# Patient Record
Sex: Female | Born: 1965
Health system: Southern US, Community
[De-identification: ages and names within clinical notes are randomized; demographics above are authoritative.]

## PROBLEM LIST (undated history)

## (undated) ENCOUNTER — Emergency Department (HOSPITAL_COMMUNITY)

## (undated) DIAGNOSIS — E785 Hyperlipidemia, unspecified: Secondary | ICD-10-CM

## (undated) DIAGNOSIS — I10 Essential (primary) hypertension: Secondary | ICD-10-CM

## (undated) DIAGNOSIS — F4310 Post-traumatic stress disorder, unspecified: Secondary | ICD-10-CM

## (undated) DIAGNOSIS — Z6841 Body Mass Index (BMI) 40.0 and over, adult: Secondary | ICD-10-CM

## (undated) DIAGNOSIS — I4891 Unspecified atrial fibrillation: Secondary | ICD-10-CM

## (undated) DIAGNOSIS — M1990 Unspecified osteoarthritis, unspecified site: Secondary | ICD-10-CM

## (undated) DIAGNOSIS — I517 Cardiomegaly: Secondary | ICD-10-CM

## (undated) HISTORY — DX: Hyperlipidemia, unspecified: E78.5

## (undated) HISTORY — DX: Body Mass Index (BMI) 40.0 and over, adult: Z684

## (undated) HISTORY — DX: Post-traumatic stress disorder, unspecified: F43.10

## (undated) HISTORY — DX: Unspecified osteoarthritis, unspecified site: M19.90

## (undated) HISTORY — DX: Essential (primary) hypertension: I10

## (undated) HISTORY — DX: Cardiomegaly: I51.7

## (undated) HISTORY — DX: Morbid (severe) obesity due to excess calories: E66.01

---

## 2000-08-11 ENCOUNTER — Emergency Department (HOSPITAL_COMMUNITY): Admission: EM | Admit: 2000-08-11 | Discharge: 2000-08-11 | Payer: Self-pay

## 2003-08-27 ENCOUNTER — Ambulatory Visit (HOSPITAL_COMMUNITY): Admission: RE | Admit: 2003-08-27 | Discharge: 2003-08-27 | Payer: Self-pay | Admitting: Family Medicine

## 2003-08-27 ENCOUNTER — Encounter: Payer: Self-pay | Admitting: Occupational Therapy

## 2007-03-01 ENCOUNTER — Emergency Department (HOSPITAL_COMMUNITY): Admission: EM | Admit: 2007-03-01 | Discharge: 2007-03-01 | Payer: Self-pay | Admitting: Emergency Medicine

## 2014-12-10 ENCOUNTER — Other Ambulatory Visit (HOSPITAL_COMMUNITY): Payer: Self-pay | Admitting: *Deleted

## 2014-12-10 DIAGNOSIS — N6310 Unspecified lump in the right breast, unspecified quadrant: Secondary | ICD-10-CM

## 2014-12-10 DIAGNOSIS — N644 Mastodynia: Secondary | ICD-10-CM

## 2014-12-20 ENCOUNTER — Ambulatory Visit (HOSPITAL_COMMUNITY)
Admission: RE | Admit: 2014-12-20 | Discharge: 2014-12-20 | Disposition: A | Discharge status: Home / Self Care | Payer: Self-pay | Source: Ambulatory Visit | Attending: Obstetrics and Gynecology | Admitting: Obstetrics and Gynecology

## 2014-12-20 ENCOUNTER — Encounter (HOSPITAL_COMMUNITY): Payer: Self-pay

## 2014-12-20 VITALS — BP 136/94 | Temp 98.1°F | Ht 65.0 in | Wt 215.4 lb

## 2014-12-20 DIAGNOSIS — Z01419 Encounter for gynecological examination (general) (routine) without abnormal findings: Secondary | ICD-10-CM

## 2014-12-20 DIAGNOSIS — N644 Mastodynia: Secondary | ICD-10-CM

## 2014-12-20 NOTE — Progress Notes (Signed)
Complaints of bilateral breast pain x 3 weeks that comes and goes. Patient rates pain at a 3 out of 10. Patient complained of a right breast lump x 2 weeks.  Pap Smear:  Pap smear completed today. Patients last Pap smear was 5 years ago and normal per patient. Per patient has no history of an abnormal Pap smear. No Pap smear results in EPIC.  Physical exam: Breasts Breasts symmetrical. No skin abnormalities bilateral breasts. No nipple retraction bilateral breasts. No nipple discharge bilateral breasts. No lymphadenopathy. No lumps palpated bilateral breasts. No lumps palpated in patients area of concern. Complaints of left inner and right breast tenderness on exam. [facility] to [facility] for diagnostic mamm[facility]. Appointment scheduled for Friday, December 21, 2014 at 1515.           Pelvic/Bimanual   Ext Genitalia No lesions, no swelling and no discharge observed on external genitalia.         Vagina Vagina pink and normal texture. No lesions or discharge observed in vagina.          Cervix Cervix is present. Cervix pink and of normal texture. No discharge observed.     Uterus Uterus is present and palpable. Uterus in retroverted and normal size.        Adnexae Bilateral ovaries present and unable to palpated. No tenderness on palpation.          Rectovaginal No rectal exam completed today since patient had no rectal complaints. No skin abnormalities observed on exam.

## 2014-12-20 NOTE — Patient Instructions (Signed)
Explained to Jaci CarrelSharon Aldava that [facility] will cover Pap smears and co-testing every 5 years unless has a history of abnormal Pap smears. [facility]. Appointment scheduled for Friday, December 21, 2014 at 1515. Patient aware of appointment and will be there. Let patient know will follow up with her within the next couple weeks with results of Pap smear by phone. Jaci CarrelSharon Farias verbalized understanding.

## 2014-12-21 ENCOUNTER — Other Ambulatory Visit: Payer: Self-pay

## 2014-12-24 ENCOUNTER — Other Ambulatory Visit (HOSPITAL_COMMUNITY): Payer: Self-pay | Admitting: *Deleted

## 2014-12-24 ENCOUNTER — Telehealth (HOSPITAL_COMMUNITY): Payer: Self-pay | Admitting: *Deleted

## 2014-12-24 DIAGNOSIS — A599 Trichomoniasis, unspecified: Secondary | ICD-10-CM

## 2014-12-24 LAB — CYTOLOGY - PAP

## 2014-12-24 MED ORDER — METRONIDAZOLE 500 MG PO TABS: 500.0000 mg | ORAL_TABLET | Freq: Two times a day (BID) | ORAL | Status: DC

## 2014-12-24 NOTE — Telephone Encounter (Signed)
Telephoned patient at home # and discussed negative pap smear results. Also negative HPV results. Next pap smear due in 5 years. Advised patient that pap smear did show trichomoniasis and that med was called to pharmacy. Patient will need to finish all medication and that partner will need to also be treated. Patient voiced understanding.

## 2014-12-31 ENCOUNTER — Ambulatory Visit
Admission: RE | Admit: 2014-12-31 | Discharge: 2014-12-31 | Disposition: A | Discharge status: Home / Self Care | Payer: No Typology Code available for payment source | Source: Ambulatory Visit | Attending: Obstetrics and Gynecology | Admitting: Obstetrics and Gynecology

## 2014-12-31 DIAGNOSIS — N644 Mastodynia: Secondary | ICD-10-CM

## 2014-12-31 DIAGNOSIS — N6310 Unspecified lump in the right breast, unspecified quadrant: Secondary | ICD-10-CM

## 2015-01-03 ENCOUNTER — Ambulatory Visit: Payer: Self-pay

## 2015-01-03 ENCOUNTER — Other Ambulatory Visit: Payer: Self-pay

## 2016-04-11 ENCOUNTER — Emergency Department (HOSPITAL_COMMUNITY)
Admission: EM | Admit: 2016-04-11 | Discharge: 2016-04-11 | Disposition: A | Discharge status: Home / Self Care | Payer: BLUE CROSS/BLUE SHIELD | Attending: Emergency Medicine | Admitting: Emergency Medicine

## 2016-04-11 ENCOUNTER — Encounter (HOSPITAL_COMMUNITY): Payer: Self-pay | Admitting: Emergency Medicine

## 2016-04-11 DIAGNOSIS — Z79899 Other long term (current) drug therapy: Secondary | ICD-10-CM | POA: Diagnosis not present

## 2016-04-11 DIAGNOSIS — K047 Periapical abscess without sinus: Secondary | ICD-10-CM

## 2016-04-11 DIAGNOSIS — K1379 Other lesions of oral mucosa: Secondary | ICD-10-CM | POA: Diagnosis not present

## 2016-04-11 DIAGNOSIS — Z87891 Personal history of nicotine dependence: Secondary | ICD-10-CM | POA: Insufficient documentation

## 2016-04-11 MED ORDER — HYDROCODONE-ACETAMINOPHEN 5-325 MG PO TABS: ORAL_TABLET | ORAL | Status: DC

## 2016-04-11 MED ORDER — HYDROCODONE-ACETAMINOPHEN 5-325 MG PO TABS
1.0000 | ORAL_TABLET | Freq: Once | ORAL | Status: AC
  Administered 2016-04-11: 1 via ORAL
  Filled 2016-04-11: qty 1

## 2016-04-11 MED ORDER — AMOXICILLIN 500 MG PO CAPS
1000.0000 mg | ORAL_CAPSULE | Freq: Once | ORAL | Status: AC
  Administered 2016-04-11: 1000 mg via ORAL
  Filled 2016-04-11: qty 2

## 2016-04-11 MED ORDER — AMOXICILLIN 500 MG PO CAPS: 1000.0000 mg | ORAL_CAPSULE | Freq: Two times a day (BID) | ORAL | Status: DC

## 2016-04-11 NOTE — ED Provider Notes (Signed)
CSN: [id number]     Arrival date & time 04/11/16  1616 History  By signing my name below, I, Audrey DarnerRussell Guerra, attest that this documentation has been prepared under the direction and in the presence of non-physician practitioner, Audrey EmeryNicole Grover Woodfield, PA-C. Electronically Signed: Linna Darnerussell Guerra, Scribe. 04/11/2016. 5:42 PM.   Chief Complaint  Patient presents with  . Mouth Lesions    The history is provided by the patient. No language interpreter was used.     HPI Comments: Audrey Guerra is a 50 y.o. female with no significant PMHx who presents to the Emergency Department complaining of sudden onset oral lesions beginning yesterday. Pt states that she has severely painful sores on the roof of her mouth; she states that the pain radiates into her nose, bilateral eyes, and bilateral ears. She endorses significant pain exacerbation with palpation to the roof of her mouth. Pt has no known allergies to medication. She has taken Tylenol #3 500 mg with mild relief.  Pt additionally here with concern for STD. She endorses pain with sexual intercourse and right hip pain She denies abnormal vaginal discharge.   History reviewed. No pertinent past medical history. Past Surgical History  Procedure Laterality Date  . Cesarean section      2 previous c-sections   Family History  Problem Relation Age of Onset  . Diabetes Mother   . Diabetes Father    Social History  Substance Use Topics  . Smoking status: Former Games developermoker  . Smokeless tobacco: Never Used  . Alcohol Use: Yes     Comment: socially   OB History    Gravida Para Term Preterm AB TAB SAB Ectopic Multiple Living   3 2 2  1 1    2      Review of Systems  A complete 10 system review of systems was obtained and all systems are negative except as noted in the HPI and PMH.   Allergies  Review of patient's allergies indicates no known allergies.  Home Medications   Prior to Admission medications   Medication Sig Start Date End Date  Taking? Authorizing Provider  metroNIDAZOLE (FLAGYL) 500 MG tablet Take 1 tablet (500 mg total) by mouth 2 (two) times daily. 12/24/14   Peggy Constant, MD   BP 171/101 mmHg  Pulse 79  Temp(Src) 98.4 F (36.9 C) (Oral)  Resp 18  SpO2 98% Physical Exam  Constitutional: She is oriented to person, place, and time. She appears well-developed and well-nourished. No distress.  HENT:  Head: Normocephalic and atraumatic.  Mouth/Throat: Uvula is midline and oropharynx is clear and moist. No trismus in the jaw. No uvula swelling. No oropharyngeal exudate, posterior oropharyngeal edema, posterior oropharyngeal erythema or tonsillar abscesses.    Generally poor dentition, no gingival swelling, erythema or tenderness to palpation. Patient is handling their secretions. There is no tenderness to palpation or firmness underneath tongue bilaterally. No trismus.    Eyes: Conjunctivae and EOM are normal. Pupils are equal, round, and reactive to light.  Neck: Neck supple. No tracheal deviation present.  Cardiovascular: Normal rate, regular rhythm and intact distal pulses.   Pulmonary/Chest: Effort normal and breath sounds normal. No stridor. No respiratory distress. She has no wheezes. She has no rales. She exhibits no tenderness.  Abdominal: Soft. She exhibits no distension and no mass. There is no tenderness. There is no rebound and no guarding.  Musculoskeletal: Normal range of motion.  Lymphadenopathy:    She has no cervical adenopathy.  Neurological: She is alert and  oriented to person, place, and time.  Skin: Skin is warm and dry.  Psychiatric: She has a normal mood and affect. Her behavior is normal.  Nursing note and vitals reviewed.   ED Course  Procedures (including critical care time)  DIAGNOSTIC STUDIES: Oxygen Saturation is 98% on RA, normal by my interpretation.    COORDINATION OF CARE: 5:42 PM Discussed treatment plan with pt at bedside and pt agreed to plan.  Labs Review Labs  Reviewed - No data to display  Imaging Review No results found. I have personally reviewed and evaluated these images and lab results as part of my medical decision-making.   EKG Interpretation None      MDM   Final diagnoses:  Dentoalveolar abscess    Filed Vitals:   04/11/16 1621 04/11/16 1839  BP: 171/101   Pulse: 79 78  Temp: 98.4 F (36.9 C)   TempSrc: Oral   Resp: 18 18  SpO2: 98%     Medications  amoxicillin (AMOXIL) capsule 1,000 mg (1,000 mg Oral Given 04/11/16 1839)  HYDROcodone-acetaminophen (NORCO/VICODIN) 5-325 MG per tablet 1 tablet (1 tablet Oral Given 04/11/16 1839)    Audrey Guerra is 50 y.o. female presenting with painful lesion to mouth, she is also requesting exam for STDs, she has dyspareunia with no discharge. She was initially sent back to the waiting room so she could be evaluated a higher level of care we could accomplish a pelvic exam and further evaluate for STDs, patient changed her mind and states she just wants to be treated for her mouth lesions and states that she can follow with her primary care for further eval for evaluation. Patient has assured me that she really does not want the STD screen tonight, I told her we will be happy to accommodate her when a room becomes available and she states that she would like to go. Physical exam is consistent with a dental abscess, she has multiple dental caries and some swelling on the hard palate. Patient will be started on amoxicillin and Vicodin, encouraged her to follow closely with her dentist. Patient verbalizes understanding.  Evaluation does not show pathology that would require ongoing emergent intervention or inpatient treatment. Pt is hemodynamically stable and mentating appropriately. Discussed findings and plan with patient/guardian, who agrees with care plan. All questions answered. Return precautions discussed and outpatient follow up given.   Discharge Medication List as of 04/11/2016  6:33  PM    START taking these medications   Details  amoxicillin (AMOXIL) 500 MG capsule Take 2 capsules (1,000 mg total) by mouth 2 (two) times daily., Starting 04/11/2016, Until Discontinued, Print    HYDROcodone-acetaminophen (NORCO/VICODIN) 5-325 MG tablet Take 1-2 tablets by mouth every 6 hours as needed for pain and/or cough., Print           Audrey Emery, PA-C 04/11/16 2000  Azalia Bilis, MD 04/12/16 [phone]

## 2016-04-11 NOTE — Discharge Instructions (Signed)
Take vicodin for breakthrough pain, do not drink alcohol, drive, care for children or do other critical tasks while taking vicodin.  Return to the emergency room for fever, change in vision, redness to the face that rapidly spreads towards the eye, nausea or vomiting, difficulty swallowing or shortness of breath.   Apply warm compresses to jaw throughout the day.   Take your antibiotics as directed and to the end of the course.   Followup with a dentist is very important for ongoing evaluation and management of recurrent dental pain. Return to emergency department for emergent changing or worsening symptoms."  Dental Abscess A dental abscess is a collection of pus in or around a tooth. CAUSES This condition is caused by a bacterial infection around the root of the tooth that involves the inner part of the tooth (pulp). It may result from:  Severe tooth decay.  Trauma to the tooth that allows bacteria to enter into the pulp, such as a broken or chipped tooth.  Severe gum disease around a tooth. SYMPTOMS Symptoms of this condition include:  Severe pain in and around the infected tooth.  Swelling and redness around the infected tooth, in the mouth, or in the face.  Tenderness.  Pus drainage.  Bad breath.  Bitter taste in the mouth.  Difficulty swallowing.  Difficulty opening the mouth.  Nausea.  Vomiting.  Chills.  Swollen neck glands.  Fever. DIAGNOSIS This condition is diagnosed with examination of the infected tooth. During the exam, your dentist may tap on the infected tooth. Your dentist will also ask about your medical and dental history and may order X-rays. TREATMENT This condition is treated by eliminating the infection. This may be done with:  Antibiotic medicine.  A root canal. This may be performed to save the tooth.  Pulling (extracting) the tooth. This may also involve draining the abscess. This is done if the tooth cannot be saved. HOME CARE  INSTRUCTIONS  Take medicines only as directed by your dentist.  If you were prescribed antibiotic medicine, finish all of it even if you start to feel better.  Rinse your mouth (gargle) often with salt water to relieve pain or swelling.  Do not drive or operate heavy machinery while taking pain medicine.  Do not apply heat to the outside of your mouth.  Keep all follow-up visits as directed by your dentist. This is important. SEEK MEDICAL CARE IF:  Your pain is worse and is not helped by medicine. SEEK IMMEDIATE MEDICAL CARE IF:  You have a fever or chills.  Your symptoms suddenly get worse.  You have a very bad headache.  You have problems breathing or swallowing.  You have trouble opening your mouth.  You have swelling in your neck or around your eye.   This information is not intended to replace advice given to you by your health care provider. Make sure you discuss any questions you have with your health care provider.   Document Released: 11/30/2005 Document Revised: 04/16/2015 Document Reviewed: 11/27/2014 Elsevier Interactive Patient Education 2016 [employer].  Low-cost dental clinic: [facility]  at [phone] Quin may also call [phone]  Dental Assistance If the dentist on-call cannot see you, please use the resources below:   Patients with Medicaid: [facility] [phone] [address], [phone] [address], [phone]  If unable to pay, or uninsured, contact [facility] [phone]) or [facility] Department [phone] in [place], [phone] in [place]) to become  qualified for the adult dental clinic  Other Low-Cost Community Dental Services: [facility]- [address], [place], [place], [address]    [phone], Ext. [phone]    2nd and 4th Thursday of the month at 6:30am    10 clients each day by appointment, can sometimes see walk-in     patients if someone does not show for an appointment [facility]- [address], [place], [place], [address]    [phone] [facility]- [address], [place], [place], [address]    [phone]  [facility]- [phone] [facility]- [phone] [facility]- [phone]

## 2016-04-11 NOTE — ED Notes (Signed)
Pt states she has had sores on the top of her mouth that pt noticed yesterday. Pt is worried she has an STD. Pt hs three red open spots on the roof of her mouth. Pt is very anxious about this at time of assessment because she states "she recently lost a friend to an unknown illness and she doesn't want the same thing to happen".

## 2016-04-11 NOTE — ED Notes (Signed)
Pt restated that she only wants to be seen for her mouth sores, not an STD test

## 2016-04-30 DIAGNOSIS — R109 Unspecified abdominal pain: Secondary | ICD-10-CM | POA: Diagnosis not present

## 2016-04-30 DIAGNOSIS — N390 Urinary tract infection, site not specified: Secondary | ICD-10-CM | POA: Diagnosis not present

## 2016-04-30 DIAGNOSIS — Z6838 Body mass index (BMI) 38.0-38.9, adult: Secondary | ICD-10-CM | POA: Diagnosis not present

## 2016-04-30 DIAGNOSIS — Z01419 Encounter for gynecological examination (general) (routine) without abnormal findings: Secondary | ICD-10-CM | POA: Diagnosis not present

## 2016-04-30 DIAGNOSIS — Z0142 Encounter for cervical smear to confirm findings of recent normal smear following initial abnormal smear: Secondary | ICD-10-CM | POA: Diagnosis not present

## 2016-04-30 DIAGNOSIS — Z113 Encounter for screening for infections with a predominantly sexual mode of transmission: Secondary | ICD-10-CM | POA: Diagnosis not present

## 2016-04-30 DIAGNOSIS — N760 Acute vaginitis: Secondary | ICD-10-CM | POA: Diagnosis not present

## 2016-05-14 DIAGNOSIS — Z6838 Body mass index (BMI) 38.0-38.9, adult: Secondary | ICD-10-CM | POA: Diagnosis not present

## 2016-05-14 DIAGNOSIS — M549 Dorsalgia, unspecified: Secondary | ICD-10-CM | POA: Diagnosis not present

## 2016-05-14 DIAGNOSIS — N390 Urinary tract infection, site not specified: Secondary | ICD-10-CM | POA: Diagnosis not present

## 2016-06-01 DIAGNOSIS — D125 Benign neoplasm of sigmoid colon: Secondary | ICD-10-CM | POA: Diagnosis not present

## 2016-06-01 DIAGNOSIS — Z8371 Family history of colonic polyps: Secondary | ICD-10-CM | POA: Diagnosis not present

## 2016-06-01 DIAGNOSIS — K5730 Diverticulosis of large intestine without perforation or abscess without bleeding: Secondary | ICD-10-CM | POA: Diagnosis not present

## 2016-06-01 DIAGNOSIS — Z1211 Encounter for screening for malignant neoplasm of colon: Secondary | ICD-10-CM | POA: Diagnosis not present

## 2016-06-02 DIAGNOSIS — Z0000 Encounter for general adult medical examination without abnormal findings: Secondary | ICD-10-CM | POA: Diagnosis not present

## 2016-06-02 DIAGNOSIS — Z13220 Encounter for screening for lipoid disorders: Secondary | ICD-10-CM | POA: Diagnosis not present

## 2016-06-04 DIAGNOSIS — Z23 Encounter for immunization: Secondary | ICD-10-CM | POA: Diagnosis not present

## 2016-06-04 DIAGNOSIS — Z0000 Encounter for general adult medical examination without abnormal findings: Secondary | ICD-10-CM | POA: Diagnosis not present

## 2017-02-14 DIAGNOSIS — M549 Dorsalgia, unspecified: Secondary | ICD-10-CM | POA: Diagnosis not present

## 2017-02-14 DIAGNOSIS — R030 Elevated blood-pressure reading, without diagnosis of hypertension: Secondary | ICD-10-CM | POA: Diagnosis not present

## 2017-02-14 DIAGNOSIS — K047 Periapical abscess without sinus: Secondary | ICD-10-CM | POA: Diagnosis not present

## 2017-02-14 DIAGNOSIS — K029 Dental caries, unspecified: Secondary | ICD-10-CM | POA: Diagnosis not present

## 2017-06-21 ENCOUNTER — Emergency Department (HOSPITAL_COMMUNITY): Payer: BLUE CROSS/BLUE SHIELD

## 2017-06-21 ENCOUNTER — Encounter (HOSPITAL_COMMUNITY): Payer: Self-pay

## 2017-06-21 ENCOUNTER — Emergency Department (HOSPITAL_COMMUNITY)
Admission: EM | Admit: 2017-06-21 | Discharge: 2017-06-21 | Disposition: A | Discharge status: Home / Self Care | Payer: BLUE CROSS/BLUE SHIELD | Attending: Emergency Medicine | Admitting: Emergency Medicine

## 2017-06-21 DIAGNOSIS — Z87891 Personal history of nicotine dependence: Secondary | ICD-10-CM | POA: Diagnosis not present

## 2017-06-21 DIAGNOSIS — M25561 Pain in right knee: Secondary | ICD-10-CM | POA: Diagnosis not present

## 2017-06-21 DIAGNOSIS — Y999 Unspecified external cause status: Secondary | ICD-10-CM | POA: Diagnosis not present

## 2017-06-21 DIAGNOSIS — Y929 Unspecified place or not applicable: Secondary | ICD-10-CM | POA: Insufficient documentation

## 2017-06-21 DIAGNOSIS — Z041 Encounter for examination and observation following transport accident: Secondary | ICD-10-CM | POA: Diagnosis not present

## 2017-06-21 DIAGNOSIS — S39012A Strain of muscle, fascia and tendon of lower back, initial encounter: Secondary | ICD-10-CM | POA: Diagnosis not present

## 2017-06-21 DIAGNOSIS — Y939 Activity, unspecified: Secondary | ICD-10-CM | POA: Insufficient documentation

## 2017-06-21 DIAGNOSIS — S8001XA Contusion of right knee, initial encounter: Secondary | ICD-10-CM

## 2017-06-21 DIAGNOSIS — S8991XA Unspecified injury of right lower leg, initial encounter: Secondary | ICD-10-CM | POA: Diagnosis not present

## 2017-06-21 MED ORDER — TRAMADOL HCL 50 MG PO TABS: 50.0000 mg | ORAL_TABLET | Freq: Four times a day (QID) | ORAL | 0 refills | Status: DC | PRN

## 2017-06-21 MED ORDER — METHOCARBAMOL 500 MG PO TABS
500.0000 mg | ORAL_TABLET | Freq: Two times a day (BID) | ORAL | 0 refills | Status: DC
Start: 2017-06-21 — End: 2018-09-13

## 2017-06-21 MED ORDER — NAPROXEN 500 MG PO TABS: 500.0000 mg | ORAL_TABLET | Freq: Two times a day (BID) | ORAL | 0 refills | Status: DC
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
## 2017-06-21 NOTE — Discharge Instructions (Signed)
Ice, elevate your knee. Naprosyn for pain as prescribed. Tramadol for severe pain. Robaxin for spasms. Follow up with family doctor if not improving in 3-5 days as needed. Return if worsening symptoms, numbness, weakness, any new concerning symptom.

## 2017-06-29 DIAGNOSIS — M545 Low back pain: Secondary | ICD-10-CM | POA: Diagnosis not present

## 2017-06-29 DIAGNOSIS — M79604 Pain in right leg: Secondary | ICD-10-CM | POA: Diagnosis not present

## 2017-06-29 DIAGNOSIS — M79621 Pain in right upper arm: Secondary | ICD-10-CM | POA: Diagnosis not present

## 2017-06-30 ENCOUNTER — Ambulatory Visit
Admission: RE | Admit: 2017-06-30 | Discharge: 2017-06-30 | Disposition: A | Discharge status: Home / Self Care | Payer: BLUE CROSS/BLUE SHIELD | Source: Ambulatory Visit | Attending: Family Medicine | Admitting: Family Medicine

## 2017-06-30 ENCOUNTER — Other Ambulatory Visit: Payer: Self-pay | Admitting: Family Medicine

## 2017-06-30 DIAGNOSIS — M25531 Pain in right wrist: Secondary | ICD-10-CM | POA: Diagnosis not present

## 2017-06-30 DIAGNOSIS — R52 Pain, unspecified: Secondary | ICD-10-CM

## 2017-06-30 DIAGNOSIS — S6991XA Unspecified injury of right wrist, hand and finger(s), initial encounter: Secondary | ICD-10-CM | POA: Diagnosis not present

## 2017-06-30 DIAGNOSIS — M19011 Primary osteoarthritis, right shoulder: Secondary | ICD-10-CM | POA: Diagnosis not present

## 2017-07-13 DIAGNOSIS — R262 Difficulty in walking, not elsewhere classified: Secondary | ICD-10-CM | POA: Diagnosis not present

## 2017-07-13 DIAGNOSIS — M25561 Pain in right knee: Secondary | ICD-10-CM | POA: Diagnosis not present

## 2017-07-13 DIAGNOSIS — M25551 Pain in right hip: Secondary | ICD-10-CM | POA: Diagnosis not present

## 2017-07-13 DIAGNOSIS — M6281 Muscle weakness (generalized): Secondary | ICD-10-CM | POA: Diagnosis not present

## 2017-07-22 DIAGNOSIS — R262 Difficulty in walking, not elsewhere classified: Secondary | ICD-10-CM | POA: Diagnosis not present

## 2017-07-22 DIAGNOSIS — M25551 Pain in right hip: Secondary | ICD-10-CM | POA: Diagnosis not present

## 2017-07-22 DIAGNOSIS — M545 Low back pain: Secondary | ICD-10-CM | POA: Diagnosis not present

## 2017-07-22 DIAGNOSIS — M25561 Pain in right knee: Secondary | ICD-10-CM | POA: Diagnosis not present

## 2017-07-27 DIAGNOSIS — M25561 Pain in right knee: Secondary | ICD-10-CM | POA: Diagnosis not present

## 2017-07-27 DIAGNOSIS — M79621 Pain in right upper arm: Secondary | ICD-10-CM | POA: Diagnosis not present

## 2017-07-27 DIAGNOSIS — M25551 Pain in right hip: Secondary | ICD-10-CM | POA: Diagnosis not present

## 2017-07-27 DIAGNOSIS — M25511 Pain in right shoulder: Secondary | ICD-10-CM | POA: Diagnosis not present

## 2017-07-27 DIAGNOSIS — R739 Hyperglycemia, unspecified: Secondary | ICD-10-CM | POA: Diagnosis not present

## 2017-07-27 DIAGNOSIS — M79604 Pain in right leg: Secondary | ICD-10-CM | POA: Diagnosis not present

## 2017-07-27 DIAGNOSIS — R262 Difficulty in walking, not elsewhere classified: Secondary | ICD-10-CM | POA: Diagnosis not present

## 2017-07-27 DIAGNOSIS — Z6838 Body mass index (BMI) 38.0-38.9, adult: Secondary | ICD-10-CM | POA: Diagnosis not present

## 2017-07-29 DIAGNOSIS — R262 Difficulty in walking, not elsewhere classified: Secondary | ICD-10-CM | POA: Diagnosis not present

## 2017-07-29 DIAGNOSIS — M25551 Pain in right hip: Secondary | ICD-10-CM | POA: Diagnosis not present

## 2017-07-29 DIAGNOSIS — M25561 Pain in right knee: Secondary | ICD-10-CM | POA: Diagnosis not present

## 2017-07-29 DIAGNOSIS — M545 Low back pain: Secondary | ICD-10-CM | POA: Diagnosis not present

## 2017-09-07 DIAGNOSIS — Z6839 Body mass index (BMI) 39.0-39.9, adult: Secondary | ICD-10-CM | POA: Diagnosis not present

## 2017-09-07 DIAGNOSIS — R739 Hyperglycemia, unspecified: Secondary | ICD-10-CM | POA: Diagnosis not present

## 2017-09-07 DIAGNOSIS — M7541 Impingement syndrome of right shoulder: Secondary | ICD-10-CM | POA: Diagnosis not present

## 2017-09-07 DIAGNOSIS — M1711 Unilateral primary osteoarthritis, right knee: Secondary | ICD-10-CM | POA: Diagnosis not present

## 2017-10-20 DIAGNOSIS — Z13220 Encounter for screening for lipoid disorders: Secondary | ICD-10-CM | POA: Diagnosis not present

## 2017-10-20 DIAGNOSIS — Z1329 Encounter for screening for other suspected endocrine disorder: Secondary | ICD-10-CM | POA: Diagnosis not present

## 2017-10-20 DIAGNOSIS — Z0000 Encounter for general adult medical examination without abnormal findings: Secondary | ICD-10-CM | POA: Diagnosis not present

## 2017-10-20 DIAGNOSIS — Z114 Encounter for screening for human immunodeficiency virus [HIV]: Secondary | ICD-10-CM | POA: Diagnosis not present

## 2017-10-25 DIAGNOSIS — Z23 Encounter for immunization: Secondary | ICD-10-CM | POA: Diagnosis not present

## 2017-10-25 DIAGNOSIS — Z0000 Encounter for general adult medical examination without abnormal findings: Secondary | ICD-10-CM | POA: Diagnosis not present

## 2017-10-25 DIAGNOSIS — Z6841 Body Mass Index (BMI) 40.0 and over, adult: Secondary | ICD-10-CM | POA: Diagnosis not present

## 2018-04-15 DIAGNOSIS — H109 Unspecified conjunctivitis: Secondary | ICD-10-CM | POA: Diagnosis not present

## 2018-05-13 DIAGNOSIS — H04123 Dry eye syndrome of bilateral lacrimal glands: Secondary | ICD-10-CM | POA: Diagnosis not present

## 2018-05-13 DIAGNOSIS — H40033 Anatomical narrow angle, bilateral: Secondary | ICD-10-CM | POA: Diagnosis not present

## 2018-09-13 ENCOUNTER — Emergency Department (HOSPITAL_COMMUNITY)
Admission: EM | Admit: 2018-09-13 | Discharge: 2018-09-13 | Disposition: A | Discharge status: Home / Self Care | Payer: No Typology Code available for payment source | Attending: Emergency Medicine | Admitting: Emergency Medicine

## 2018-09-13 ENCOUNTER — Emergency Department (HOSPITAL_COMMUNITY): Payer: No Typology Code available for payment source

## 2018-09-13 ENCOUNTER — Encounter (HOSPITAL_COMMUNITY): Payer: Self-pay | Admitting: Emergency Medicine

## 2018-09-13 ENCOUNTER — Other Ambulatory Visit: Payer: Self-pay

## 2018-09-13 DIAGNOSIS — S40012A Contusion of left shoulder, initial encounter: Secondary | ICD-10-CM | POA: Insufficient documentation

## 2018-09-13 DIAGNOSIS — S0990XA Unspecified injury of head, initial encounter: Secondary | ICD-10-CM | POA: Insufficient documentation

## 2018-09-13 DIAGNOSIS — S2231XA Fracture of one rib, right side, initial encounter for closed fracture: Secondary | ICD-10-CM | POA: Diagnosis not present

## 2018-09-13 DIAGNOSIS — M25512 Pain in left shoulder: Secondary | ICD-10-CM | POA: Diagnosis not present

## 2018-09-13 DIAGNOSIS — Y92410 Unspecified street and highway as the place of occurrence of the external cause: Secondary | ICD-10-CM | POA: Diagnosis not present

## 2018-09-13 DIAGNOSIS — S4992XA Unspecified injury of left shoulder and upper arm, initial encounter: Secondary | ICD-10-CM | POA: Diagnosis not present

## 2018-09-13 DIAGNOSIS — S2232XA Fracture of one rib, left side, initial encounter for closed fracture: Secondary | ICD-10-CM | POA: Diagnosis not present

## 2018-09-13 DIAGNOSIS — Z87891 Personal history of nicotine dependence: Secondary | ICD-10-CM | POA: Insufficient documentation

## 2018-09-13 DIAGNOSIS — Y9389 Activity, other specified: Secondary | ICD-10-CM | POA: Insufficient documentation

## 2018-09-13 DIAGNOSIS — S299XXA Unspecified injury of thorax, initial encounter: Secondary | ICD-10-CM | POA: Diagnosis not present

## 2018-09-13 DIAGNOSIS — S199XXA Unspecified injury of neck, initial encounter: Secondary | ICD-10-CM | POA: Diagnosis not present

## 2018-09-13 DIAGNOSIS — M546 Pain in thoracic spine: Secondary | ICD-10-CM | POA: Diagnosis not present

## 2018-09-13 DIAGNOSIS — S59902A Unspecified injury of left elbow, initial encounter: Secondary | ICD-10-CM | POA: Diagnosis not present

## 2018-09-13 DIAGNOSIS — M25522 Pain in left elbow: Secondary | ICD-10-CM | POA: Diagnosis not present

## 2018-09-13 DIAGNOSIS — Y990 Civilian activity done for income or pay: Secondary | ICD-10-CM | POA: Insufficient documentation

## 2018-09-13 MED ORDER — METHOCARBAMOL 500 MG PO TABS: 500.0000 mg | ORAL_TABLET | Freq: Three times a day (TID) | ORAL | 0 refills | Status: AC

## 2018-09-13 MED ORDER — HYDROCODONE-ACETAMINOPHEN 5-325 MG PO TABS
1.0000 | ORAL_TABLET | Freq: Once | ORAL | Status: AC
  Administered 2018-09-13: 1 via ORAL
  Filled 2018-09-13: qty 1

## 2018-09-13 MED ORDER — METHOCARBAMOL 500 MG PO TABS
1000.0000 mg | ORAL_TABLET | Freq: Once | ORAL | Status: AC
Start: 2018-09-13 — End: 2018-09-13
  Administered 2018-09-13: 1000 mg via ORAL
  Filled 2018-09-13: qty 2

## 2018-09-13 MED ORDER — OXYCODONE HCL 5 MG PO TABS: 5.0000 mg | ORAL_TABLET | ORAL | 0 refills | Status: AC | PRN
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
## 2018-09-13 NOTE — ED Notes (Signed)
C-collar applied in triage.
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
## 2018-09-13 NOTE — ED Notes (Signed)
Patient verbalizes understanding of discharge instructions. Opportunity for questioning and answers were provided. Armband removed by staff, pt discharged from ED to home via POV  
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
## 2018-09-19 DIAGNOSIS — M179 Osteoarthritis of knee, unspecified: Secondary | ICD-10-CM | POA: Diagnosis not present

## 2018-09-19 DIAGNOSIS — M79621 Pain in right upper arm: Secondary | ICD-10-CM | POA: Diagnosis not present

## 2018-09-19 DIAGNOSIS — S2232XD Fracture of one rib, left side, subsequent encounter for fracture with routine healing: Secondary | ICD-10-CM | POA: Diagnosis not present

## 2018-09-19 DIAGNOSIS — S40012D Contusion of left shoulder, subsequent encounter: Secondary | ICD-10-CM | POA: Diagnosis not present

## 2018-09-23 DIAGNOSIS — I10 Essential (primary) hypertension: Secondary | ICD-10-CM | POA: Diagnosis not present

## 2018-09-23 DIAGNOSIS — S40012D Contusion of left shoulder, subsequent encounter: Secondary | ICD-10-CM | POA: Diagnosis not present

## 2018-09-23 DIAGNOSIS — M179 Osteoarthritis of knee, unspecified: Secondary | ICD-10-CM | POA: Diagnosis not present

## 2018-09-23 DIAGNOSIS — S2232XD Fracture of one rib, left side, subsequent encounter for fracture with routine healing: Secondary | ICD-10-CM | POA: Diagnosis not present

## 2018-10-24 DIAGNOSIS — Z13220 Encounter for screening for lipoid disorders: Secondary | ICD-10-CM | POA: Diagnosis not present

## 2018-10-24 DIAGNOSIS — Z1329 Encounter for screening for other suspected endocrine disorder: Secondary | ICD-10-CM | POA: Diagnosis not present

## 2018-10-24 DIAGNOSIS — Z0000 Encounter for general adult medical examination without abnormal findings: Secondary | ICD-10-CM | POA: Diagnosis not present

## 2018-10-24 DIAGNOSIS — Z114 Encounter for screening for human immunodeficiency virus [HIV]: Secondary | ICD-10-CM | POA: Diagnosis not present

## 2018-11-09 DIAGNOSIS — Z113 Encounter for screening for infections with a predominantly sexual mode of transmission: Secondary | ICD-10-CM | POA: Diagnosis not present

## 2018-11-09 DIAGNOSIS — Z23 Encounter for immunization: Secondary | ICD-10-CM | POA: Diagnosis not present

## 2018-11-09 DIAGNOSIS — Z1151 Encounter for screening for human papillomavirus (HPV): Secondary | ICD-10-CM | POA: Diagnosis not present

## 2018-11-09 DIAGNOSIS — Z1211 Encounter for screening for malignant neoplasm of colon: Secondary | ICD-10-CM | POA: Diagnosis not present

## 2018-11-09 DIAGNOSIS — Z118 Encounter for screening for other infectious and parasitic diseases: Secondary | ICD-10-CM | POA: Diagnosis not present

## 2018-11-09 DIAGNOSIS — N760 Acute vaginitis: Secondary | ICD-10-CM | POA: Diagnosis not present

## 2018-11-09 DIAGNOSIS — Z0000 Encounter for general adult medical examination without abnormal findings: Secondary | ICD-10-CM | POA: Diagnosis not present

## 2018-11-09 DIAGNOSIS — Z01419 Encounter for gynecological examination (general) (routine) without abnormal findings: Secondary | ICD-10-CM | POA: Diagnosis not present

## 2018-11-24 DIAGNOSIS — H16223 Keratoconjunctivitis sicca, not specified as Sjogren's, bilateral: Secondary | ICD-10-CM | POA: Diagnosis not present

## 2018-11-29 DIAGNOSIS — I10 Essential (primary) hypertension: Secondary | ICD-10-CM | POA: Diagnosis not present

## 2018-11-29 DIAGNOSIS — J069 Acute upper respiratory infection, unspecified: Secondary | ICD-10-CM | POA: Diagnosis not present

## 2018-11-29 DIAGNOSIS — E785 Hyperlipidemia, unspecified: Secondary | ICD-10-CM | POA: Diagnosis not present

## 2018-11-29 DIAGNOSIS — Z6841 Body Mass Index (BMI) 40.0 and over, adult: Secondary | ICD-10-CM | POA: Diagnosis not present

## 2018-11-29 DIAGNOSIS — E782 Mixed hyperlipidemia: Secondary | ICD-10-CM | POA: Diagnosis not present

## 2018-11-29 DIAGNOSIS — R739 Hyperglycemia, unspecified: Secondary | ICD-10-CM | POA: Diagnosis not present

## 2018-12-05 DIAGNOSIS — I10 Essential (primary) hypertension: Secondary | ICD-10-CM | POA: Diagnosis not present

## 2018-12-05 DIAGNOSIS — Z23 Encounter for immunization: Secondary | ICD-10-CM | POA: Diagnosis not present

## 2018-12-05 DIAGNOSIS — J069 Acute upper respiratory infection, unspecified: Secondary | ICD-10-CM | POA: Diagnosis not present

## 2018-12-05 DIAGNOSIS — J329 Chronic sinusitis, unspecified: Secondary | ICD-10-CM | POA: Diagnosis not present

## 2018-12-05 DIAGNOSIS — Z6841 Body Mass Index (BMI) 40.0 and over, adult: Secondary | ICD-10-CM | POA: Diagnosis not present

## 2019-01-06 DIAGNOSIS — H04123 Dry eye syndrome of bilateral lacrimal glands: Secondary | ICD-10-CM | POA: Diagnosis not present

## 2019-01-12 DIAGNOSIS — H1013 Acute atopic conjunctivitis, bilateral: Secondary | ICD-10-CM | POA: Diagnosis not present
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
## 2019-06-12 DIAGNOSIS — E782 Mixed hyperlipidemia: Secondary | ICD-10-CM | POA: Diagnosis not present

## 2019-06-15 DIAGNOSIS — E782 Mixed hyperlipidemia: Secondary | ICD-10-CM | POA: Diagnosis not present

## 2019-06-15 DIAGNOSIS — Z719 Counseling, unspecified: Secondary | ICD-10-CM | POA: Diagnosis not present

## 2019-06-15 DIAGNOSIS — E669 Obesity, unspecified: Secondary | ICD-10-CM | POA: Diagnosis not present

## 2019-06-15 DIAGNOSIS — I10 Essential (primary) hypertension: Secondary | ICD-10-CM | POA: Diagnosis not present

## 2019-09-19 DIAGNOSIS — R739 Hyperglycemia, unspecified: Secondary | ICD-10-CM | POA: Diagnosis not present

## 2019-09-19 DIAGNOSIS — M549 Dorsalgia, unspecified: Secondary | ICD-10-CM | POA: Diagnosis not present

## 2019-09-19 DIAGNOSIS — E782 Mixed hyperlipidemia: Secondary | ICD-10-CM | POA: Diagnosis not present

## 2019-09-19 DIAGNOSIS — I10 Essential (primary) hypertension: Secondary | ICD-10-CM | POA: Diagnosis not present

## 2019-09-28 ENCOUNTER — Other Ambulatory Visit: Payer: Self-pay

## 2019-09-28 DIAGNOSIS — J069 Acute upper respiratory infection, unspecified: Secondary | ICD-10-CM | POA: Diagnosis not present

## 2019-09-28 DIAGNOSIS — K0889 Other specified disorders of teeth and supporting structures: Secondary | ICD-10-CM | POA: Diagnosis not present

## 2019-09-28 DIAGNOSIS — J329 Chronic sinusitis, unspecified: Secondary | ICD-10-CM | POA: Diagnosis not present

## 2019-09-28 DIAGNOSIS — Z20822 Contact with and (suspected) exposure to covid-19: Secondary | ICD-10-CM

## 2019-09-30 LAB — NOVEL CORONAVIRUS, NAA: SARS-CoV-2, NAA: NOT DETECTED

## 2019-10-02 DIAGNOSIS — K0889 Other specified disorders of teeth and supporting structures: Secondary | ICD-10-CM | POA: Diagnosis not present

## 2019-10-02 DIAGNOSIS — J329 Chronic sinusitis, unspecified: Secondary | ICD-10-CM | POA: Diagnosis not present

## 2019-10-17 DIAGNOSIS — Z23 Encounter for immunization: Secondary | ICD-10-CM | POA: Diagnosis not present
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
## 2021-02-25 LAB — IFOBT (OCCULT BLOOD): IFOBT: NEGATIVE

## 2021-07-14 ENCOUNTER — Encounter: Payer: Self-pay | Admitting: Cardiovascular Disease

## 2021-07-14 ENCOUNTER — Encounter: Payer: Self-pay | Admitting: Skilled Nursing Facility1

## 2021-07-14 ENCOUNTER — Encounter: Payer: 59 | Attending: General Surgery | Admitting: Skilled Nursing Facility1

## 2021-07-14 ENCOUNTER — Ambulatory Visit (INDEPENDENT_AMBULATORY_CARE_PROVIDER_SITE_OTHER): Payer: 59 | Admitting: Cardiovascular Disease

## 2021-07-14 ENCOUNTER — Other Ambulatory Visit: Payer: Self-pay

## 2021-07-14 VITALS — BP 106/68 | HR 71 | Ht 64.0 in | Wt 262.0 lb

## 2021-07-14 DIAGNOSIS — I5032 Chronic diastolic (congestive) heart failure: Secondary | ICD-10-CM | POA: Diagnosis not present

## 2021-07-14 DIAGNOSIS — I4891 Unspecified atrial fibrillation: Secondary | ICD-10-CM | POA: Insufficient documentation

## 2021-07-14 DIAGNOSIS — I480 Paroxysmal atrial fibrillation: Secondary | ICD-10-CM

## 2021-07-14 DIAGNOSIS — E669 Obesity, unspecified: Secondary | ICD-10-CM | POA: Diagnosis present

## 2021-07-14 NOTE — Progress Notes (Signed)
Cardiology Office Note:    Date:  07/14/2021   ID:  Audrey Guerra, DOB 1966-05-25, MRN [id number]  PCP:  Lewis Moccasin, MD   [facility] Providers Cardiologist:  Kristeen Miss, MD     Referring MD: Gaspar Garbe, MD   Chief Complaint  Patient presents with   New Patient (Initial Visit)        Atrial Fibrillation   Congestive Heart Failure     Aug. 1, 2022   Audrey Guerra is a 55 y.o. female with a hx of morbid obesity, HTN, has a history of a cardiomyopathy.  She was previously seeing a cardiologist in [facility] but now he is out of network.  She has had an echocardiogram about a year ago. Echocardiogram from July 12, 2020 done at [facility] reveals normal left ventricular systolic function with ejection fraction of 55 to 60%.  She has grade 1 diastolic dysfunction.  No significant valvular abnormalities.  Is not very active , limited by arthritis  Works as a Runner, broadcasting/film/video IT sales professional)   No angina  Breathing is ok Limited by pain from arthritis   I spoke with Dr. Kennon Rounds office ( [facility]  [place])  An event monitor showed PAF She was started on Xarelto and metoprolol following that event monitor    Past Medical History:  Diagnosis Date   BMI 40.0-44.9, adult (HCC)    Cardiomegaly    Hyperlipidemia    Hypertension    Morbid obesity (HCC)    Osteoarthritis    PTSD (post-traumatic stress disorder)     Past Surgical History:  Procedure Laterality Date   CESAREAN SECTION     2 previous c-sections    Current Medications: Current Meds  Medication Sig   diclofenac Sodium (VOLTAREN) 1 % GEL Apply topically 4 (four) times daily.   metoprolol tartrate (LOPRESSOR) 25 MG tablet Take 25 mg by mouth in the morning and at bedtime.   rivaroxaban (XARELTO) 20 MG TABS tablet Take 20 mg by mouth daily.   rosuvastatin (CRESTOR) 5 MG tablet Take 5 mg by mouth daily.   valsartan-hydrochlorothiazide (DIOVAN-HCT) 80-12.5 MG tablet Take 1  tablet by mouth daily.     Allergies:   Patient has no known allergies.   Social History   Socioeconomic History   Marital status: Single    Spouse name: Not on file   Number of children: Not on file   Years of education: Not on file   Highest education level: Not on file  Occupational History   Not on file  Tobacco Use   Smoking status: Former   Smokeless tobacco: Never  Substance and Sexual Activity   Alcohol use: Yes    Comment: social   Drug use: No   Sexual activity: Yes    Birth control/protection: Condom  Other Topics Concern   Not on file  Social History Narrative   Not on file   Social Determinants of Health   Financial Resource Strain: Not on file  Food Insecurity: Not on file  Transportation Needs: Not on file  Physical Activity: Not on file  Stress: Not on file  Social Connections: Not on file     Family History: The patient's family history includes Diabetes in her father and mother.  ROS:   Please see the history of present illness.     All other systems reviewed and are negative.  EKGs/Labs/Other Studies Reviewed:    The following studies were reviewed today: Records  from Dr. Wylene Simmer and from cardiologist in [facility] , including echo report   EKG:   Aug. 1, 2022:  NSR,  frequent PACs ,  U waves.   Recent Labs: No results found for requested labs within last 8760 hours.  Recent Lipid Panel No results found for: CHOL, TRIG, HDL, CHOLHDL, VLDL, LDLCALC, LDLDIRECT   Risk Assessment/Calculations:           Physical Exam:    VS:  BP 106/68 (BP Location: Left Arm, Patient Position: Sitting, Cuff Size: Large)   Pulse 71   Ht 5\' 4"  (1.626 m)   Wt 262 lb (118.8 kg)   BMI 44.97 kg/m     Wt Readings from Last 3 Encounters:  07/14/21 262 lb (118.8 kg)  07/14/21 262 lb 6.4 oz (119 kg)  06/21/17 220 lb (99.8 kg)     GEN:  morbidly obese female,  NAD  HEENT: Normal NECK: No JVD; No carotid bruits LYMPHATICS: No  lymphadenopathy CARDIAC: RRR, no murmurs, rubs, gallops RESPIRATORY:  Clear to auscultation without rales, wheezing or rhonchi  ABDOMEN: Soft, non-tender, non-distended MUSCULOSKELETAL:  No edema; No deformity  SKIN: Warm and dry NEUROLOGIC:  Alert and oriented x 3 PSYCHIATRIC:  Normal affect   ASSESSMENT:    1. Chronic diastolic heart failure (HCC)   2. Paroxysmal atrial fibrillation (HCC)   3. Chronic diastolic CHF (congestive heart failure), NYHA class 1 (HCC)   4. Morbid obesity (HCC)    PLAN:        Chronic diastolic CHF :   Echo from July 2021 shows normal LV systolic function.  Grade 1 DD.  Normal LV size.   She has no angina.  Shortness of breath is gradually improving.  She is at low risk for her upcoming bariatric surgery.  2.   Paroxysmal atrial fib: She had an event monitor placed which revealed paroxysmal atrial fibrillation.  She was started on Xarelto and metoprolol at that point..  She is on Xarelto.     She needs to have bariatric surgery.  She is at low risk for this surgery and she may hold her Xarelto for 2 to 3 days prior to surgery.  She should restart several days following her surgery at the discretion of the surgeon.  3.  Morbid obesity; scheduled for bariatric surgery.  4. HTN: Blood pressure is well controlled.   Medication Adjustments/Labs and Tests Ordered: Current medicines are reviewed at length with the patient today.  Concerns regarding medicines are outlined above.  Orders Placed This Encounter  Procedures   EKG 12-Lead   No orders of the defined types were placed in this encounter.   Patient Instructions  Medication Instructions:  Your physician recommends that you continue on your current medications as directed. Please refer to the Current Medication list given to you today.  *If you need a refill on your cardiac medications before your next appointment, please call your pharmacy*  Follow-Up: At [facility], you and your health  needs are our priority.  As part of our continuing mission to provide you with exceptional heart care, we have created designated Provider Care Teams.  These Care Teams include your primary Cardiologist (physician) and Advanced Practice Providers (APPs -  Physician Assistants and Nurse Practitioners) who all work together to provide you with the care you need, when you need it.  Your next appointment:   6 month(s)  The format for your next appointment:   In Person  Provider:   You  will see one of the following Advanced Practice Providers on your designated Care Team:   Tereso Newcomer, PA-C [place] Aus, [place]     Signed, Kristeen Miss, MD  07/14/2021 5:18 PM    [facility]

## 2021-07-14 NOTE — Patient Instructions (Signed)
Medication Instructions:  Your physician recommends that you continue on your current medications as directed. Please refer to the Current Medication list given to you today.  *If you need a refill on your cardiac medications before your next appointment, please call your pharmacy*  Follow-Up: At [facility], you and your health needs are our priority.  As part of our continuing mission to provide you with exceptional heart care, we have created designated Provider Care Teams.  These Care Teams include your primary Cardiologist (physician) and Advanced Practice Providers (APPs -  Physician Assistants and Nurse Practitioners) who all work together to provide you with the care you need, when you need it.   Your next appointment:   6 month(s)  The format for your next appointment:   In Person  Provider:   You will see one of the following Advanced Practice Providers on your designated Care Team:    Scott Weaver, PA-C  Vin Bhagat, PA-C    

## 2021-07-14 NOTE — Progress Notes (Signed)
Nutrition Assessment for Bariatric Surgery Medical Nutrition Therapy Appt Start Time: 8:10    End Time: 9:22  Patient was seen on 07/14/2021 for Pre-Operative Nutrition Assessment. Letter of approval faxed to [facility] bariatric surgery program coordinator on 07/14/2021.   Referral stated Supervised Weight Loss (SWL) visits needed: 6  Planned surgery: sleeve gastrectomy  Pt expectation of surgery: to lose weight  Pt expectation of dietitian: none identified     NUTRITION ASSESSMENT   Anthropometrics  Start weight at [facility]: 262.4 lbs (date: 07/14/2021)  Height: 64 in BMI: 45.04 kg/m2     Clinical  Medical hx: anxiety,  Medications: see list  Labs: none in EMR Notable signs/symptoms: none Any previous deficiencies? No  Micronutrient Nutrition Focused Physical Exam: Hair: No issues observed Eyes: No issues observed Mouth: No issues observed Neck: No issues observed Nails: No issues observed Skin: No issues observed  Lifestyle & Dietary Hx  Pt states she is avoiding fried food. Pt states she is trying to avoid sweets. Pt states she wants to get out of the mindset she has to finish all of her food on her plate. Pt states she is an emotional eater. Pt states she does not eat beef.   24-Hr Dietary Recall First Meal: 1 boiled egg or toast + cream cheese + tomato Snack:  Second Meal: trailmix with dried fruit + chips or skipped Snack:  Third Meal: zicchini + onion or catfish nugget + zuccini + squash Snack:  Beverages: water   Estimated Energy Needs Calories: 1600   NUTRITION DIAGNOSIS  Overweight/obesity ([place]-3.3) related to past poor dietary habits and physical inactivity as evidenced by patient w/ planned sleeve gastrectomy surgery following dietary guidelines for continued weight loss.    NUTRITION INTERVENTION  Nutrition counseling (C-1) and education (E-2) to facilitate bariatric surgery goals.  Educated pt on micronutrient deficiencies post  surgery and strategies to mitigate that risk   Pre-Op Goals Reviewed with the Patient Track food and beverage intake (pen and paper, MyFitness Pal, Baritastic app, etc.) Make healthy food choices while monitoring portion sizes Consume 3 meals per day or try to eat every 3-5 hours Avoid concentrated sugars and fried foods Keep sugar & fat in the single digits per serving on food labels Practice CHEWING your food (aim for applesauce consistency) Practice not drinking 15 minutes before, during, and 30 minutes after each meal and snack Avoid all carbonated beverages (ex: soda, sparkling beverages)  Limit caffeinated beverages (ex: coffee, tea, energy drinks) Avoid all sugar-sweetened beverages (ex: regular soda, sports drinks)  Avoid alcohol  Aim for 64-100 ounces of FLUID daily (with at least half of fluid intake being plain water)  Aim for at least 60-80 grams of PROTEIN daily Look for a liquid protein source that contains ?15 g protein and ?5 g carbohydrate (ex: shakes, drinks, shots) Make a list of non-food related activities Physical activity is an important part of a healthy lifestyle so keep it moving! The goal is to reach 150 minutes of exercise per week, including cardiovascular and weight baring activity: try out roller skating   *Goals that are bolded indicate the pt would like to start working towards these  Handouts Provided Include  Bariatric Surgery handouts (Nutrition Visits, Pre-Op Goals, Protein Shakes, Vitamins & Minerals)  Learning Style & Readiness for Change Teaching method utilized: Visual & Auditory  Demonstrated degree of understanding via: Teach Back  Readiness Level: Action Barriers to learning/adherence to lifestyle change: none stated  RD's Notes for Next Visit  Assess pts adherence to chosen goals      MONITORING & EVALUATION Dietary intake, weekly physical activity, body weight, and pre-op goals reached at next nutrition visit.    Next Steps  Patient  is to follow up at [facility] for Pre-Op Class >2 weeks before surgery for further nutrition education.  Return for next SWL visit

## 2021-08-14 ENCOUNTER — Encounter: Payer: 59 | Attending: General Surgery | Admitting: Skilled Nursing Facility1

## 2021-08-14 ENCOUNTER — Other Ambulatory Visit: Payer: Self-pay

## 2021-08-14 NOTE — Progress Notes (Signed)
Supervised Weight Loss Visit Bariatric Nutrition Education  Planned Surgery: sleeve gastrectomy   1 out of 6 SWL visits    NUTRITION ASSESSMENT  Anthropometrics  Start weight at [facility]: 262.4 lbs (date: 07/14/2021)  Weight: 256.3 pounds BMI: 43.99 kg/m2     Clinical  Medical hx: anxiety,  Medications: see list  Labs: none in EMR Notable signs/symptoms: none Any previous deficiencies? No  Lifestyle & Dietary Hx  Pt states she has been working on portion sizes by following her hands. Pt states she has started ozempic.  Pt states he is already feeling better ad able tow alk farther since making changes.  Pt states she has her coworkers working on the changes too which has been really helpful and supportive.   Estimated daily fluid intake: unknown oz Supplements: none Current average weekly physical activity: walking 30 minutes 2 days a  week  24-Hr Dietary Recall First Meal: protein shake Snack:  Second Meal: protein shake Snack: trail mix or nuts Third Meal: chicken + broccoli + mac n cheese Snack:  Beverages: water  Estimated Energy Needs Calories: 1500   NUTRITION DIAGNOSIS  Overweight/obesity ([place]-3.3) related to past poor dietary habits and physical inactivity as evidenced by patient w/ planned sleeve gastrectomy surgery following dietary guidelines for continued weight loss.   NUTRITION INTERVENTION  Nutrition counseling (C-1) and education (E-2) to facilitate bariatric surgery goals.  Educated pt on non hunger eating and boredeum eating   Pre-Op Goals Progress & New Goals Continue: Practice not drinking 15 minutes before, during, and 30 minutes after each meal and snack Continue: Physical activity is an important part of a healthy lifestyle so keep it moving! The goal is to reach 150 minutes of exercise per week, including cardiovascular and weight baring activity NEW: increase walking 3 days a week 30 minutes  NEW: eat lunch  Handouts Provided Include   Meal ideas sheet  Learning Style & Readiness for Change Teaching method utilized: Visual & Auditory  Demonstrated degree of understanding via: Teach Back  Readiness Level: Action Barriers to learning/adherence to lifestyle change: none identified  RD's Notes for next Visit  Assess pts adherence to chosen goals   MONITORING & EVALUATION Dietary intake, weekly physical activity, body weight, and pre-op goals in 1 month.   Next Steps  Patient is to return to [facility] in one month

## 2021-09-24 ENCOUNTER — Other Ambulatory Visit: Payer: Self-pay

## 2021-09-24 ENCOUNTER — Encounter: Payer: 59 | Attending: General Surgery | Admitting: Skilled Nursing Facility1

## 2021-09-24 NOTE — Progress Notes (Signed)
Supervised Weight Loss Visit Bariatric Nutrition Education  Planned Surgery: sleeve gastrectomy   2 out of 6 SWL visits    NUTRITION ASSESSMENT  Anthropometrics  Start weight at [facility]: 262.4 lbs (date: 07/14/2021)  Weight: 247.6 pounds BMI: 42.48 kg/m2     Clinical  Medical hx: anxiety,  Medications: see list; ozempic Labs: none in EMR Notable signs/symptoms: none Any previous deficiencies? No  Lifestyle & Dietary Hx   Pt states she does not feel 10 pounds loss in 1 month is good enough. Pt stets she has not been able to walk as much as she was before due to a hip bruise. Pt states she has been a  little constipated.   Estimated daily fluid intake: unknown oz Supplements: none Current average weekly physical activity: walking 30 minutes 2 days a  week  24-Hr Dietary Recall First Meal: protein shake Snack:  Second Meal: protein shake or chicken salad + 4 crackers Snack  4: 6 chips Third Meal: chicken + broccoli + mac n cheese or shrimp + zucchini or salmon + rice Snack:  Beverages: water  Estimated Energy Needs Calories: 1500   NUTRITION DIAGNOSIS  Overweight/obesity ([place]-3.3) related to past poor dietary habits and physical inactivity as evidenced by patient w/ planned sleeve gastrectomy surgery following dietary guidelines for continued weight loss.   NUTRITION INTERVENTION  Nutrition counseling (C-1) and education (E-2) to facilitate bariatric surgery goals.  Educated pt on non hunger eating and boredeum eating  Importance of consistency with change through eventual weight stalls  Pre-Op Goals Progress & New Goals Continue: Practice not drinking 15 minutes before, during, and 30 minutes after each meal and snack Continue: Physical activity is an important part of a healthy lifestyle so keep it moving! The goal is to reach 150 minutes of exercise per week, including cardiovascular and weight baring activity continue: increase walking 3 days a week 30 minutes   continue: eat lunch NEW: add a piece of fruit with breakfast and a non starchy vegetable with lunch daily   Handouts Previously Provided Include  Meal ideas sheet  Learning Style & Readiness for Change Teaching method utilized: Visual & Auditory  Demonstrated degree of understanding via: Teach Back  Readiness Level: Action Barriers to learning/adherence to lifestyle change: none identified  RD's Notes for next Visit  Assess pts adherence to chosen goals   MONITORING & EVALUATION Dietary intake, weekly physical activity, body weight, and pre-op goals in 1 month.   Next Steps  Patient is to return to [facility] in one month

## 2021-10-20 ENCOUNTER — Other Ambulatory Visit: Payer: Self-pay

## 2021-10-20 ENCOUNTER — Encounter: Payer: 59 | Attending: General Surgery | Admitting: Skilled Nursing Facility1

## 2021-10-20 NOTE — Progress Notes (Signed)
Supervised Weight Loss Visit Bariatric Nutrition Education  Planned Surgery: sleeve gastrectomy   3 out of 6 SWL visits    NUTRITION ASSESSMENT  Anthropometrics  Start weight at [facility]: 262.4 lbs (date: 07/14/2021)  Weight: 240.6 pounds BMI: 41.32 kg/m2     Clinical  Medical hx: anxiety Medications: see list; ozempic Labs: none in EMR Notable signs/symptoms: none Any previous deficiencies? No  Lifestyle & Dietary Hx   Pt states her motivation lately has been getting her leg healed.  Pt states she walks with her neighbor.  Pt states she is not as tired as she used to be when walking and just normal activities for the day have been easier.   Estimated daily fluid intake: unknown oz Supplements: none Current average weekly physical activity: walking 30 minutes 2 days a  week  24-Hr Dietary Recall First Meal: protein shake or boiled egg Snack:  Second Meal: protein shake or chicken salad  Snack  4: plum Third Meal: broccoli Snack:  Beverages: water, cranberry juice   Estimated Energy Needs Calories: 1500   NUTRITION DIAGNOSIS  Overweight/obesity ([place]-3.3) related to past poor dietary habits and physical inactivity as evidenced by patient w/ planned sleeve gastrectomy surgery following dietary guidelines for continued weight loss.   NUTRITION INTERVENTION  Nutrition counseling (C-1) and education (E-2) to facilitate bariatric surgery goals.  Educated pt on non hunger eating and boredeum eating  Importance of consistency with change through eventual weight stalls  Pre-Op Goals Progress & New Goals Continue: Practice not drinking 15 minutes before, during, and 30 minutes after each meal and snack Continue: Physical activity is an important part of a healthy lifestyle so keep it moving! The goal is to reach 150 minutes of exercise per week, including cardiovascular and weight baring activity continue: increase walking 3 days a week 30 minutes  continue: eat  lunch continue: add a piece of fruit with breakfast and a non starchy vegetable with lunch daily  NEW: work on creating balanced meals  Handouts Previously Provided Include  Meal ideas sheet  Learning Style & Readiness for Change Teaching method utilized: Visual & Auditory  Demonstrated degree of understanding via: Teach Back  Readiness Level: Action Barriers to learning/adherence to lifestyle change: none identified  RD's Notes for next Visit  Assess pts adherence to chosen goals   MONITORING & EVALUATION Dietary intake, weekly physical activity, body weight, and pre-op goals in 1 month.   Next Steps  Patient is to return to [facility] in one month

## 2021-11-11 ENCOUNTER — Ambulatory Visit: Payer: 59 | Admitting: Skilled Nursing Facility1

## 2021-11-19 ENCOUNTER — Other Ambulatory Visit: Payer: Self-pay

## 2021-11-19 ENCOUNTER — Encounter: Payer: Self-pay | Admitting: Dietician

## 2021-11-19 ENCOUNTER — Encounter: Payer: 59 | Attending: General Surgery | Admitting: Dietician

## 2021-11-19 VITALS — Ht 64.0 in | Wt 239.6 lb

## 2021-11-19 DIAGNOSIS — E669 Obesity, unspecified: Secondary | ICD-10-CM | POA: Diagnosis not present

## 2021-11-19 NOTE — Progress Notes (Signed)
Supervised Weight Loss Visit Bariatric Nutrition Education  Planned Surgery: sleeve gastrectomy   4 out of 6 SWL visits    NUTRITION ASSESSMENT  Anthropometrics  Start weight at [facility]: 262.4 lbs (date: 07/14/2021)  Weight: 239.6 pounds BMI: 41.13 kg/m2     Clinical  Medical hx: anxiety Medications: see list; ozempic Labs: none in EMR Notable signs/symptoms: none Any previous deficiencies? No  Lifestyle & Dietary Hx Pt has been using a 12 oz bowl to control their portions. Pt has 3 sponsors (neighbor, best friend, co-worker) that are helping them on their weight loss journey. Pt reports that they are going to focus on their health over Christmas and avoid stressors.  Pt states her motivation lately has been getting her leg healed.  Pt states she walks with her neighbor.  Pt states she is not as tired as she used to be when walking and just normal activities for the day have been easier.   Estimated daily fluid intake: unknown oz Supplements: none Current average weekly physical activity: walking 30 minutes 2 days a  week  24-Hr Dietary Recall First Meal: 1 slice of whole wheat toast with cream cheese and tomato Snack:  Second Meal: Sour cream and chive nabs Snack  4: plum Third Meal: Grilled chicken salad with tomatoes, onions, cheese Snack:  Beverages: water,   Estimated Energy Needs Calories: 1500   NUTRITION DIAGNOSIS  Overweight/obesity ([place]-3.3) related to past poor dietary habits and physical inactivity as evidenced by patient w/ planned sleeve gastrectomy surgery following dietary guidelines for continued weight loss.   NUTRITION INTERVENTION  Nutrition counseling (C-1) and education (E-2) to facilitate bariatric surgery goals.  Educated pt on non hunger eating and boredeum eating  Importance of consistency with change through eventual weight stalls  Pre-Op Goals Progress & New Goals Continue: Practice not drinking 15 minutes before, during, and 30  minutes after each meal and snack Continue: Physical activity is an important part of a healthy lifestyle so keep it moving! The goal is to reach 150 minutes of exercise per week, including cardiovascular and weight baring activity continue: increase walking 3 days a week 30 minutes  continue: eat lunch continue: add a piece of fruit with breakfast and a non starchy vegetable with lunch daily  Continue: work on creating balanced meals NEW: Work on getting protein at [employer].  Handouts Previously Provided Include  Meal ideas sheet  Learning Style & Readiness for Change Teaching method utilized: Visual & Auditory  Demonstrated degree of understanding via: Teach Back  Readiness Level: Action Barriers to learning/adherence to lifestyle change: none identified  RD's Notes for next Visit  Assess pts adherence to chosen goals   MONITORING & EVALUATION Dietary intake, weekly physical activity, body weight, and pre-op goals in 1 month.   Next Steps  Patient is to return to [facility] in one month

## 2022-01-12 ENCOUNTER — Encounter: Payer: Self-pay | Attending: General Surgery | Admitting: Skilled Nursing Facility1

## 2022-01-12 ENCOUNTER — Other Ambulatory Visit: Payer: Self-pay

## 2022-01-12 NOTE — Progress Notes (Signed)
Supervised Weight Loss Visit Bariatric Nutrition Education  Planned Surgery: sleeve gastrectomy   5 out of 6 SWL visits    NUTRITION ASSESSMENT  Anthropometrics  Start weight at [facility]: 262.4 lbs (date: 07/14/2021)  Weight: 233 pounds BMI: 39.99 kg/m2     Clinical  Medical hx: anxiety Medications: see list; ozempic Labs: none in EMR Notable signs/symptoms: none Any previous deficiencies? No  Lifestyle & Dietary Hx Continued: Pt has been using a 12 oz bowl to control their portions. Pt has 3 sponsors (neighbor, best friend, co-worker) that are helping them on their weight loss journey. Pt reports that they are going to focus on their health over Christmas and avoid stressors.  Continued: Pt states her motivation lately has been getting her leg healed.  Pt states she walks with her neighbor.  Pt states she is not as tired as she used to be when walking and just normal activities for the day have been easier.   Pt states she does not snack very often if she does it is one serving of fruit or yogurt. Pt state she has been more active lately.  Pt states he grandkids has had her more active.  June wants to be 160 pounds.  Estimated daily fluid intake: unknown oz Supplements: none Current average weekly physical activity: walking 30 minutes 2 days a  week  24-Hr Dietary Recall First Meal: 1 slice of whole wheat toast with cream cheese and tomato or fast food (half the sanwhich) Snack:  Second Meal: Sour cream and chive nabs or other half of breakfast biscuit Snack  4: plum or grapes Third Meal: Grilled chicken salad with tomatoes, onions, cheese or chicken and brussels  Snack:  Beverages: water,   Estimated Energy Needs Calories: 1500   NUTRITION DIAGNOSIS  Overweight/obesity ([place]-3.3) related to past poor dietary habits and physical inactivity as evidenced by patient w/ planned sleeve gastrectomy surgery following dietary guidelines for continued weight  loss.   NUTRITION INTERVENTION  Nutrition counseling (C-1) and education (E-2) to facilitate bariatric surgery goals.  Educated pt on non hunger eating and boredeum eating  Importance of consistency with change through eventual weight stalls  Pre-Op Goals Progress & New Goals Continue: Practice not drinking 15 minutes before, during, and 30 minutes after each meal and snack Continue: Physical activity is an important part of a healthy lifestyle so keep it moving! The goal is to reach 150 minutes of exercise per week, including cardiovascular and weight baring activity continue: increase walking 3 days a week 30 minutes  continue: eat lunch continue: add a piece of fruit with breakfast and a non starchy vegetable with lunch daily  Continue: work on creating balanced meals continue: Work on getting protein at [employer]. NEW: an hour on the treadmill  Handouts Previously Provided Include  Meal ideas sheet  Learning Style & Readiness for Change Teaching method utilized: Visual & Auditory  Demonstrated degree of understanding via: Teach Back  Readiness Level: Action Barriers to learning/adherence to lifestyle change: none identified  RD's Notes for next Visit  Assess pts adherence to chosen goals   MONITORING & EVALUATION Dietary intake, weekly physical activity, body weight, and pre-op goals in 1 month.   Next Steps  Patient is to return to [facility] in one month

## 2022-02-05 ENCOUNTER — Encounter: Payer: Self-pay | Attending: General Surgery | Admitting: Skilled Nursing Facility1

## 2022-02-05 ENCOUNTER — Other Ambulatory Visit: Payer: Self-pay

## 2022-02-05 NOTE — Progress Notes (Signed)
Supervised Weight Loss Visit Bariatric Nutrition Education  Planned Surgery: sleeve gastrectomy   6 out of 6 SWL visits   Pt completed visits.   Pt has cleared nutrition requirements.    Pt states due to achieving this success so far she wants to see what she can do for herself without the surgery and states she was a bit afraid of getting a major surgery in the first place.  NUTRITION ASSESSMENT  Anthropometrics  Start weight at [facility]: 262.4 lbs (date: 07/14/2021)  Weight: 225.7 pounds BMI: 38.74 kg/m2     Clinical  Medical hx: anxiety Medications: see list; ozempic Labs: none in EMR Notable signs/symptoms: none Any previous deficiencies? No  Lifestyle & Dietary Hx  Pt states she is no longer taking ozempic.  Pt has lost 38 pounds since starting her appts in August.  Pt states she is so very exrcited for herself and feels so accomplished stating all of the strategies he has learned in these vsits has given her the success she has achieved and will continue towards her health goals. Pt states she uses bowls for her approptiet serving size  Pt states she gets on the treadmill every morning before work and after work totaling an hour each day.  Pt states her next goal is [facility] will [facility] to break up her workout routine.   Estimated daily fluid intake: unknown oz Supplements: none Current average weekly physical activity: walking 30 minutes 2 days a  week  24-Hr Dietary Recall First Meal: 1 slice of whole wheat toast with cream cheese and tomato or fast food (half the sanwhich) or protein shake Snack:  Second Meal: shrimp salad green leafy Snack  4: plum or grapes Third Meal: Grilled chicken salad with tomatoes, onions, cheese or chicken and brussels or couscous and mushrooms and shrimp and carrots Snack:  Beverages: water  Estimated Energy Needs Calories: 1500   NUTRITION DIAGNOSIS  Overweight/obesity ([place]-3.3) related to past poor dietary habits and physical  inactivity as evidenced by patient w/ planned sleeve gastrectomy surgery following dietary guidelines for continued weight loss.   NUTRITION INTERVENTION  Nutrition counseling (C-1) and education (E-2) to facilitate bariatric surgery goals.  Educated pt on non hunger eating and boredeum eating  Importance of consistency with change through eventual weight stalls  Pre-Op Goals Progress & New Goals Continue: Practice not drinking 15 minutes before, during, and 30 minutes after each meal and snack Continue: Physical activity is an important part of a healthy lifestyle so keep it moving! The goal is to reach 150 minutes of exercise per week, including cardiovascular and weight baring activity continue: increase walking 3 days a week 30 minutes  continue: eat lunch continue: add a piece of fruit with breakfast and a non starchy vegetable with lunch daily  Continue: work on creating balanced meals continue: Work on getting protein at [employer]. Continue: an hour on the treadmill  Handouts Previously Provided Include  Meal ideas sheet  Learning Style & Readiness for Change Teaching method utilized: Visual & Auditory  Demonstrated degree of understanding via: Teach Back  Readiness Level: Action Barriers to learning/adherence to lifestyle change: none identified    MONITORING & EVALUATION Dietary intake, weekly physical activity, body weight, and pre-op goals  Next Steps  Patient is to return to [facility] as needed and to call or e-mail if need of any further support Pt has completed visits. No further supervised visits required/recomended

## 2022-04-27 NOTE — Progress Notes (Signed)
? ? ?Office Visit  ?  ?Patient Name: Audrey Guerra ?Date of Encounter: 04/28/2022 ? ?Primary Care Provider:  Lewis Moccasin, MD ?Primary Cardiologist:  Kristeen Miss, MD ?Primary Electrophysiologist: None ? ?Chief Complaint  ?  ?Audrey Guerra is a 56 y.o. female with PMH of paroxysmal atrial fibrillation, HFpEF,NICM, HTN, HLD, PTSD, morbid obesity, osteoarthritis who presents for overdue 61-month follow-up. ?Past Medical History  ?  ?Past Medical History:  ?Diagnosis Date  ? BMI 40.0-44.9, adult (HCC)   ? Cardiomegaly   ? Hyperlipidemia   ? Hypertension   ? Morbid obesity (HCC)   ? Osteoarthritis   ? PTSD (post-traumatic stress disorder)   ? ?Past Surgical History:  ?Procedure Laterality Date  ? CESAREAN SECTION    ? 2 previous c-sections  ? ? ?Allergies ? ?No Known Allergies ? ?History of Present Illness  ?  ?Audrey Guerra was last seen by Dr. Elease Hashimoto for new patient consultation who was a former patient of Kennon Rounds, MD in [facility]. She works as a Runner, broadcasting/film/video at [employer].  She had 2D echo completed on 06/2020 that revealed EF of 50-60% and grade 1 diastolic dysfunction.  She was referred to cardiology on 05/2020 with complaints of palpitations and PACs on EKG.  She wore a 14-day ZIO monitor that revealed atrial fibrillation and patient was started on metoprolol and Xarelto.  2D echo was ordered that revealed EF of 55-60%, normal systolic function with no evidence of pericardial effusion or significant valvular stenosis or regurgitation.  She was scheduled to have bariatric surgery and cleared by Dr. Elease Hashimoto last appointment. ? ?Since last being seen in the office patient reports she is doing well with no complaints of angina or shortness of breath.  She is fairly inactive due to chronic arthritis in her knees.  She was cleared back in August to have gastric sleeve surgery but has yet to have this done. However she is still planning to have this completed in the future. She is complaint  with her medications and reports no bleeding issue with her Xarelto. She denies chest pain, palpitations, dyspnea, PND, orthopnea, nausea, vomiting, dizziness, syncope, edema, weight gain, or early satiety. ? ? ?Home Medications  ?  ?Current Outpatient Medications  ?Medication Sig Dispense Refill  ? diclofenac Sodium (VOLTAREN) 1 % GEL Apply topically 4 (four) times daily.    ? metoprolol tartrate (LOPRESSOR) 25 MG tablet Take 25 mg by mouth in the morning and at bedtime.    ? rivaroxaban (XARELTO) 20 MG TABS tablet Take 20 mg by mouth daily.    ? rosuvastatin (CRESTOR) 5 MG tablet Take 5 mg by mouth daily.    ? valsartan-hydrochlorothiazide (DIOVAN-HCT) 80-12.5 MG tablet Take 1 tablet by mouth daily.    ? ?No current facility-administered medications for this visit.  ?  ? ?Review of Systems  ?Please see the history of present illness.    ?(+)Chronic knee pain ?All other systems reviewed and are otherwise negative except as noted above. ? ?Physical Exam  ?  ?Wt Readings from Last 3 Encounters:  ?04/28/22 232 lb 9.6 oz (105.5 kg)  ?02/05/22 225 lb 11.2 oz (102.4 kg)  ?01/12/22 233 lb (105.7 kg)  ? ?VS: ?Vitals:  ? 04/28/22 1124  ?BP: 130/70  ?Pulse: 72  ?SpO2: 97%  ?,Body mass index is 37.54 kg/m?. ? ?Constitutional:   ?   Appearance:  She is obese an female,  Not in distress.  ?Neck:  ?   Vascular: JVD  normal.  ?Pulmonary:  ?   Effort: Pulmonary effort is normal.  ?   Breath sounds: No wheezing. No rales. Diminished in the bases ?Cardiovascular:  ?   Normal rate. Regular rhythm. Normal S1. Normal S2.   ?   Murmurs: There is no murmur.  ?Edema: ?   Peripheral edema present in the left ankle without pitting ?Abdominal:  ?   Palpations: Abdomen is soft non tender. There is no hepatomegaly.  ?Skin: ?   General: Skin is warm and dry.  ?Neurological:  ?   General: No focal deficit present.  ?   Mental Status: Alert and oriented to person, place and time.  ?   Cranial Nerves: Cranial nerves are intact.  ?EKG/LABS/Other  Studies Reviewed  ?  ?ECG personally reviewed by me today -none completed today ? ? ?CHA2DS2-VASc Score = 3  ? This indicates a 3.2% annual risk of stroke. ?The patient's score is based upon: ?CHF History: 1 ?HTN History: 1 ?Diabetes History: 0 ?Stroke History: 0 ?Vascular Disease History: 0 ?Age Score: 0 ?Gender Score: 1 ?  ? ?  ?Assessment & Plan  ?  ?1.  Chronic diastolic heart failure: ?-2D echo completed 06/2020 with EF of 55-60%, grade 1 DD ?-Low sodium diet, fluid restriction <2L, and daily weights encouraged. Educated to contact our office for weight gain of 2 lbs overnight or 5 lbs in one week.  ?-Patient is doing well overall and is at low risk for upcoming bariatric surgery ? ?2.  Paroxysmal atrial fibrillation: ?-She is currently on metoprolol 25 mg daily for rate control. ?-No bleeding reported ?-Continue Xarelto 20 mg ?-Dose appropriate based on creatinine clearance of 130 ?mL/min ? ?3.  Morbid obesity: ?-Current BMI is 37.5 kg/m?  ?-Patient planning to have gastric sleeve surgery in the near future ? ?4.  Hypertension: ?-Blood pressure today was 130/70 controlled on current blood pressure regimen ? ?5.  Hyperlipidemia: ?-Continue Crestor 5 mg daily ?-Most recent LDL was 64 on 02/2021 ? ?Disposition: Follow-up with Kristeen Miss, MD or APP in 6 months ?   ?Medication Adjustments/Labs and Tests Ordered: ?Current medicines are reviewed at length with the patient today.  Concerns regarding medicines are outlined above.  ? ?Signed, ?Napoleon Form, Leodis Rains, NP ?04/28/2022, 12:18 PM ?[facility] ?

## 2022-04-28 ENCOUNTER — Encounter: Payer: Self-pay | Admitting: Nurse Practitioner

## 2022-04-28 ENCOUNTER — Ambulatory Visit (INDEPENDENT_AMBULATORY_CARE_PROVIDER_SITE_OTHER): Payer: 59 | Admitting: Nurse Practitioner

## 2022-04-28 VITALS — BP 130/70 | HR 72 | Ht 66.0 in | Wt 232.6 lb

## 2022-04-28 DIAGNOSIS — I5032 Chronic diastolic (congestive) heart failure: Secondary | ICD-10-CM | POA: Diagnosis not present

## 2022-04-28 DIAGNOSIS — I480 Paroxysmal atrial fibrillation: Secondary | ICD-10-CM

## 2022-04-28 LAB — BASIC METABOLIC PANEL
BUN/Creatinine Ratio: 21 (ref 9–23)
BUN: 16 mg/dL (ref 6–24)
CO2: 26 mmol/L (ref 20–29)
Calcium: 9.9 mg/dL (ref 8.7–10.2)
Chloride: 100 mmol/L (ref 96–106)
Creatinine, Ser: 0.78 mg/dL (ref 0.57–1.00)
Glucose: 91 mg/dL (ref 70–99)
Potassium: 3.8 mmol/L (ref 3.5–5.2)
Sodium: 141 mmol/L (ref 134–144)
eGFR: 89 mL/min/{1.73_m2} (ref >59)

## 2022-04-28 LAB — CBC
Hematocrit: 37.8 % (ref 34.0–46.6)
Hemoglobin: 13.1 g/dL (ref 11.1–15.9)
MCH: 30.3 pg (ref 26.6–33.0)
MCHC: 34.7 g/dL (ref 31.5–35.7)
MCV: 87 fL (ref 79–97)
Platelets: 269 10*3/uL (ref 150–450)
RBC: 4.33 x10E6/uL (ref 3.77–5.28)
RDW: 13.4 % (ref 11.7–15.4)
WBC: 8.6 10*3/uL (ref 3.4–10.8)

## 2022-04-28 NOTE — Patient Instructions (Signed)
Medication Instructions:  ?Your physician recommends that you continue on your current medications as directed. Please refer to the Current Medication list given to you today. ? ?*If you need a refill on your cardiac medications before your next appointment, please call your pharmacy* ? ? ?Lab Work: ?TODAY:  BMET & CBC ? ?If you have labs (blood work) drawn today and your tests are completely normal, you will receive your results only by: ?MyChart Message (if you have MyChart) OR ?A paper copy in the mail ?If you have any lab test that is abnormal or we need to change your treatment, we will call you to review the results. ? ? ?Testing/Procedures: ?None ordered ? ? ?Follow-Up: ?At [facility], you and your health needs are our priority.  As part of our continuing mission to provide you with exceptional heart care, we have created designated Provider Care Teams.  These Care Teams include your primary Cardiologist (physician) and Advanced Practice Providers (APPs -  Physician Assistants and Nurse Practitioners) who all work together to provide you with the care you need, when you need it. ? ?We recommend signing up for the patient portal called "MyChart".  Sign up information is provided on this After Visit Summary.  MyChart is used to connect with patients for Virtual Visits (Telemedicine).  Patients are able to view lab/test results, encounter notes, upcoming appointments, etc.  Non-urgent messages can be sent to your provider as well.   ?To learn more about what you can do with MyChart, go to [email or web address].   ? ?Your next appointment:   ?6 month(s) ? ?The format for your next appointment:   ?In Person ? ?Provider:   ?Kristeen MissPhilip Nahser, MD   ? ? ?Other Instructions ?DASH Eating Plan ?DASH stands for Dietary Approaches to Stop Hypertension. The DASH eating plan is a healthy eating plan that has been shown to: ?Reduce high blood pressure (hypertension). ?Reduce your risk for type 2 diabetes, heart disease,  and stroke. ?Help with weight loss. ?What are tips for following this plan? ?Reading food labels ?Check food labels for the amount of salt (sodium) per serving. Choose foods with less than 5 percent of the Daily Value of sodium. Generally, foods with less than 300 milligrams (mg) of sodium per serving fit into this eating plan. ?To find whole grains, look for the word "whole" as the first word in the ingredient list. ?Shopping ?Buy products labeled as "low-sodium" or "no salt added." ?Buy fresh foods. Avoid canned foods and pre-made or frozen meals. ?Cooking ?Avoid adding salt when cooking. Use salt-free seasonings or herbs instead of table salt or sea salt. Check with your health care provider or pharmacist before using salt substitutes. ?Do not fry foods. Cook foods using healthy methods such as baking, boiling, grilling, roasting, and broiling instead. ?Cook with heart-healthy oils, such as olive, canola, avocado, soybean, or sunflower oil. ?Meal planning ? ?Eat a balanced diet that includes: ?4 or more servings of fruits and 4 or more servings of vegetables each day. Try to fill one-half of your plate with fruits and vegetables. ?6-8 servings of whole grains each day. ?Less than 6 oz (170 g) of lean meat, poultry, or fish each day. A 3-oz (85-g) serving of meat is about the same size as a deck of cards. One egg equals 1 oz (28 g). ?2-3 servings of low-fat dairy each day. One serving is 1 cup (237 mL). ?1 serving of nuts, seeds, or beans 5 times each week. ?2-3 servings of heart-healthy  fats. Healthy fats called omega-3 fatty acids are found in foods such as walnuts, flaxseeds, fortified milks, and eggs. These fats are also found in cold-water fish, such as sardines, salmon, and mackerel. ?Limit how much you eat of: ?Canned or prepackaged foods. ?Food that is high in trans fat, such as some fried foods. ?Food that is high in saturated fat, such as fatty meat. ?Desserts and other sweets, sugary drinks, and other  foods with added sugar. ?Full-fat dairy products. ?Do not salt foods before eating. ?Do not eat more than 4 egg yolks a week. ?Try to eat at least 2 vegetarian meals a week. ?Eat more home-cooked food and less restaurant, buffet, and fast food. ?Lifestyle ?When eating at a restaurant, ask that your food be prepared with less salt or no salt, if possible. ?If you drink alcohol: ?Limit how much you use to: ?0-1 drink a day for women who are not pregnant. ?0-2 drinks a day for men. ?Be aware of how much alcohol is in your drink. In the [place] one drink equals one 12 oz bottle of beer (355 mL), one 5 oz glass of wine (148 mL), or one 1? oz glass of hard liquor (44 mL). ?General information ?Avoid eating more than 2,300 mg of salt a day. If you have hypertension, you may need to reduce your sodium intake to 1,500 mg a day. ?Work with your health care provider to maintain a healthy body weight or to lose weight. Ask what an ideal weight is for you. ?Get at least 30 minutes of exercise that causes your heart to beat faster (aerobic exercise) most days of the week. Activities may include walking, swimming, or biking. ?Work with your health care provider or dietitian to adjust your eating plan to your individual calorie needs. ?What foods should I eat? ?Fruits ?All fresh, dried, or frozen fruit. Canned fruit in natural juice (without added sugar). ?Vegetables ?Fresh or frozen vegetables (raw, steamed, roasted, or grilled). Low-sodium or reduced-sodium tomato and vegetable juice. Low-sodium or reduced-sodium tomato sauce and tomato paste. Low-sodium or reduced-sodium canned vegetables. ?Grains ?Whole-grain or whole-wheat bread. Whole-grain or whole-wheat pasta. Brown rice. Orpah Cobb. Bulgur. Whole-grain and low-sodium cereals. Pita bread. Low-fat, low-sodium crackers. Whole-wheat flour tortillas. ?Meats and other proteins ?Skinless chicken or [place]. Ground chicken or [place]. Pork with fat trimmed off. Fish and seafood.  Egg whites. Dried beans, peas, or lentils. Unsalted nuts, nut butters, and seeds. Unsalted canned beans. Lean cuts of beef with fat trimmed off. Low-sodium, lean precooked or cured meat, such as sausages or meat loaves. ?Dairy ?Low-fat (1%) or fat-free (skim) milk. Reduced-fat, low-fat, or fat-free cheeses. Nonfat, low-sodium ricotta or cottage cheese. Low-fat or nonfat yogurt. Low-fat, low-sodium cheese. ?Fats and oils ?Soft margarine without trans fats. Vegetable oil. Reduced-fat, low-fat, or light mayonnaise and salad dressings (reduced-sodium). Canola, safflower, olive, avocado, soybean, and sunflower oils. Avocado. ?Seasonings and condiments ?Herbs. Spices. Seasoning mixes without salt. ?Other foods ?Unsalted popcorn and pretzels. Fat-free sweets. ?The items listed above may not be a complete list of foods and beverages you can eat. Contact a dietitian for more information. ?What foods should I avoid? ?Fruits ?Canned fruit in a light or heavy syrup. Fried fruit. Fruit in cream or butter sauce. ?Vegetables ?Creamed or fried vegetables. Vegetables in a cheese sauce. Regular canned vegetables (not low-sodium or reduced-sodium). Regular canned tomato sauce and paste (not low-sodium or reduced-sodium). Regular tomato and vegetable juice (not low-sodium or reduced-sodium). Rosita Fire. Olives. ?Grains ?Baked goods made with fat, such as  croissants, muffins, or some breads. Dry pasta or rice meal packs. ?Meats and other proteins ?Fatty cuts of meat. Ribs. Fried meat. Tomasa Blase. Bologna, salami, and other precooked or cured meats, such as sausages or meat loaves. Fat from the back of a pig (fatback). Bratwurst. Salted nuts and seeds. Canned beans with added salt. Canned or smoked fish. Whole eggs or egg yolks. Chicken or [place] with skin. ?Dairy ?Whole or 2% milk, cream, and half-and-half. Whole or full-fat cream cheese. Whole-fat or sweetened yogurt. Full-fat cheese. Nondairy creamers. Whipped toppings. Processed cheese and  cheese spreads. ?Fats and oils ?Butter. Stick margarine. Lard. Shortening. Ghee. Bacon fat. Tropical oils, such as coconut, palm kernel, or palm oil. ?Seasonings and condiments ?Onion salt, garlic salt, s

## 2022-05-25 ENCOUNTER — Encounter: Payer: 59 | Attending: General Surgery | Admitting: Skilled Nursing Facility1

## 2022-05-25 ENCOUNTER — Encounter: Payer: Self-pay | Admitting: Skilled Nursing Facility1

## 2022-05-25 NOTE — Progress Notes (Signed)
Pre-Operative Nutrition Class:    Patient was seen on 05/25/2022 for Pre-Operative Bariatric Surgery Education at [facility].    Surgery date:  Surgery type: RYGB Start weight at [facility]: 262 Weight today: 232  Samples given per MNT protocol. Patient educated on appropriate usage: Bariatric Advantage Multivitamin Lot # [id number] Exp:08/24   Bariatric Advantage Calcium  Lot #[id number] Exp: 10/19/2022   Protein Shake Lot # [id number] Exp: [id number]  The following the learning objectives were met by the patient during this course: Identify Pre-Op Dietary Goals and will begin 2 weeks pre-operatively Identify appropriate sources of fluids and proteins  State protein recommendations and appropriate sources pre and post-operatively Identify Post-Operative Dietary Goals and will follow for 2 weeks post-operatively Identify appropriate multivitamin and calcium sources Describe the need for physical activity post-operatively and will follow MD recommendations State when to call healthcare provider regarding medication questions or post-operative complications When having a diagnosis of diabetes understanding hypoglycemia symptoms and the inclusion of 1 complex carbohydrate per meal  Handouts given during class include: Pre-Op Bariatric Surgery Diet Handout Protein Shake Handout Post-Op Bariatric Surgery Nutrition Handout BELT Program Information Flyer Support Group Information Flyer [facility] Bariatric Supplements Price List  Follow-Up Plan: Patient will follow-up at [facility] 2 weeks post operatively for diet advancement per MD.

## 2022-05-29 ENCOUNTER — Other Ambulatory Visit: Payer: Self-pay

## 2022-05-29 MED ORDER — RIVAROXABAN 20 MG PO TABS: 20.0000 mg | ORAL_TABLET | Freq: Every day | ORAL | 3 refills | Status: DC

## 2022-06-05 ENCOUNTER — Telehealth: Payer: Self-pay | Admitting: *Deleted

## 2022-06-05 NOTE — Telephone Encounter (Signed)
   Name: Audrey Guerra  DOB: 06-Nov-1966  MRN: [id number]   Primary Cardiologist: Kristeen Miss, MD  Chart reviewed as part of pre-operative protocol coverage. Patient was contacted 06/05/2022 in reference to pre-operative risk assessment for pending surgery as outlined below.  Audrey Guerra was last seen on 04/28/2022 by Gypsy Balsam NP.  Since that day, Audrey Guerra has done well without exertional chest pain or worsening dyspnea.  Therefore, based on ACC/AHA guidelines, the patient would be at acceptable risk for the planned procedure without further cardiovascular testing.   Patient was previously cleared for the same procedure in August 2022, however surgery has been delayed until now.  Dr. Elease Hashimoto recommended holding Xarelto for 2 to 3 days prior to the surgery and restart as soon as possible afterward at the surgeon's discretion.  The patient was advised that if she develops new symptoms prior to surgery to contact our office to arrange for a follow-up visit, and she verbalized understanding.  I will route this recommendation to the requesting party via Epic fax function and remove from pre-op pool. Please call with questions.  [place], [place] 06/05/2022, 3:59 PM

## 2022-06-11 ENCOUNTER — Ambulatory Visit: Payer: Self-pay | Admitting: General Surgery

## 2022-06-17 ENCOUNTER — Other Ambulatory Visit (HOSPITAL_COMMUNITY): Payer: Self-pay

## 2022-06-18 NOTE — Progress Notes (Addendum)
COVID Vaccine Completed:  Date of COVID positive in last 90 days:  PCP - Maryelizabeth Rowan, MD Cardiologist - Laqueta Carina, MD  Cardiac clearance by Danielle Rankin 06/05/22 in Epic  Chest x-ray -  EKG -07/14/21 Epic  Stress Test -  ECHO - 07/12/20 [facility] Cardiac Cath -  Pacemaker/ICD device last checked: Spinal Cord Stimulator:  Bowel Prep - no solids after 6pm night before  Sleep Study -  CPAP -   Fasting Blood Sugar -  Checks Blood Sugar _____ times a day  Blood Thinner Instructions: Xarelto, hold 2-3 days Aspirin Instructions: Last Dose:  Activity level:  Can go up a flight of stairs and perform activities of daily living without stopping and without symptoms of chest pain or shortness of breath.  Able to exercise without symptoms  Unable to go up a flight of stairs without symptoms of     Anesthesia review: CHF, a fib, HTN, cardiomyopathy   Patient denies shortness of breath, fever, cough and chest pain at PAT appointment  Patient verbalized understanding of instructions that were given to them at the PAT appointment. Patient was also instructed that they will need to review over the PAT instructions again at home before surgery.

## 2022-06-19 NOTE — Patient Instructions (Signed)
DUE TO COVID-19 ONLY TWO VISITORS  (aged 56 and older)  ARE ALLOWED TO COME WITH YOU AND STAY IN THE WAITING ROOM ONLY DURING PRE OP AND PROCEDURE.   **NO VISITORS ARE ALLOWED IN THE SHORT STAY AREA OR RECOVERY ROOM!!**  IF YOU WILL BE ADMITTED INTO THE HOSPITAL YOU ARE ALLOWED ONLY FOUR SUPPORT PEOPLE DURING VISITATION HOURS ONLY (7 AM -8PM)   The support person(s) must pass our screening, gel in and out, and wear a mask at all times, including in the patient's room. Patients must also wear a mask when staff or their support person are in the room. Visitors GUEST BADGE MUST BE WORN VISIBLY  One adult visitor may remain with you overnight and MUST be in the room by 8 P.M.     Your procedure is scheduled on: 06/30/22   Report to [facility] Main Entrance    Report to admitting at 11:15 AM   Call this number if you have problems the morning of surgery [phone]   MORNING OF SURGERY DRINK:   DRINK 1 G2 drink BEFORE YOU LEAVE HOME, DRINK ALL OF THE  G2 DRINK AT ONE TIME.   NO SOLID FOOD AFTER 600 PM THE NIGHT BEFORE YOUR SURGERY. YOU MAY DRINK CLEAR FLUIDS. THE G2 DRINK YOU DRINK BEFORE YOU LEAVE HOME WILL BE THE LAST FLUIDS YOU DRINK BEFORE SURGERY.  PAIN IS EXPECTED AFTER SURGERY AND WILL NOT BE COMPLETELY ELIMINATED. AMBULATION AND TYLENOL WILL HELP REDUCE INCISIONAL AND GAS PAIN. MOVEMENT IS KEY!  YOU ARE EXPECTED TO BE OUT OF BED WITHIN 4 HOURS OF ADMISSION TO YOUR PATIENT ROOM.  SITTING IN THE RECLINER THROUGHOUT THE DAY IS IMPORTANT FOR DRINKING FLUIDS AND MOVING GAS THROUGHOUT THE GI TRACT.  COMPRESSION STOCKINGS SHOULD BE WORN [facility] STAY UNLESS YOU ARE WALKING.   INCENTIVE SPIROMETER SHOULD BE USED EVERY HOUR WHILE AWAKE TO DECREASE POST-OPERATIVE COMPLICATIONS SUCH AS PNEUMONIA.  WHEN DISCHARGED HOME, IT IS IMPORTANT TO CONTINUE TO WALK EVERY HOUR AND USE THE INCENTIVE SPIROMETER EVERY HOUR.    You may have the following liquids until 10:30 AM DAY  OF SURGERY  Water Black Coffee (sugar ok, NO MILK/CREAM OR CREAMERS)  Tea (sugar ok, NO MILK/CREAM OR CREAMERS) regular and decaf                             Plain Jell-O (NO RED)                                           Fruit ices (not with fruit pulp, NO RED)                                     Popsicles (NO RED)                                                                  Juice: apple, WHITE grape, WHITE cranberry Sports drinks like Gatorade (NO RED) Clear broth(vegetable,chicken,beef)  The day of surgery:  Drink ONE (1) Pre-Surgery G2 at 10:30 AM the morning of surgery. Drink in one sitting. Do not sip.  This drink was given to you during your hospital  pre-op appointment visit. Nothing else to drink after completing the  Pre-Surgery G2.          If you have questions, please contact your surgeon's office.   FOLLOW BOWEL PREP AND ANY ADDITIONAL PRE OP INSTRUCTIONS YOU RECEIVED FROM YOUR SURGEON'S OFFICE!!!     Oral Hygiene is also important to reduce your risk of infection.                                    Remember - BRUSH YOUR TEETH THE MORNING OF SURGERY WITH YOUR REGULAR TOOTHPASTE   Take these medicines the morning of surgery with A SIP OF WATER: Metoprolol, Rosuvastatin  These are anesthesia recommendations for holding your anticoagulants.  Please contact your prescribing physician to confirm IF it is safe to hold your anticoagulants for this length of time.   Eliquis Apixaban   72 hours   Xarelto Rivaroxaban   72 hours  Plavix Clopidogrel   120 hours  Pletal Cilostazol   120 hours                                 You may not have any metal on your body including hair pins, jewelry, and body piercing             Do not wear make-up, lotions, powders, perfumes, or deodorant  Do not wear nail polish including gel and S&S, artificial/acrylic nails, or any other type of covering on natural nails including finger and toenails. If you have artificial  nails, gel coating, etc. that needs to be removed by a nail salon please have this removed prior to surgery or surgery may need to be canceled/ delayed if the surgeon/ anesthesia feels like they are unable to be safely monitored.   Do not shave  48 hours prior to surgery.    Do not bring valuables to the hospital. [facility] IS NOT             RESPONSIBLE   FOR VALUABLES.   Contacts, dentures or bridgework may not be worn into surgery.   Bring small overnight bag day of surgery.   DO NOT BRING YOUR HOME MEDICATIONS TO THE HOSPITAL. PHARMACY WILL DISPENSE MEDICATIONS LISTED ON YOUR MEDICATION LIST TO YOU DURING YOUR ADMISSION IN THE HOSPITAL!    Special Instructions: Bring a copy of your healthcare power of attorney and living will documents         the day of surgery if you haven't scanned them before.              Please read over the following fact sheets you were given: IF YOU HAVE QUESTIONS ABOUT YOUR PRE-OP INSTRUCTIONS PLEASE CALL [phone]- [facility] - Preparing for Surgery Before surgery, you can play an important role.  Because skin is not sterile, your skin needs to be as free of germs as possible.  You can reduce the number of germs on your skin by washing with CHG (chlorahexidine gluconate) soap before surgery.  CHG is an antiseptic cleaner which kills germs and bonds with the skin to continue killing germs even after washing.  Please DO NOT use if you have an allergy to CHG or antibacterial soaps.  If your skin becomes reddened/irritated stop using the CHG and inform your nurse when you arrive at Short Stay. Do not shave (including legs and underarms) for at least 48 hours prior to the first CHG shower.  You may shave your face/neck.  Please follow these instructions carefully:  1.  Shower with CHG Soap the night before surgery and the  morning of surgery.  2.  If you choose to wash your hair, wash your hair first as usual with your normal  shampoo.  3.  After  you shampoo, rinse your hair and body thoroughly to remove the shampoo.                             4.  Use CHG as you would any other liquid soap.  You can apply chg directly to the skin and wash.  Gently with a scrungie or clean washcloth.  5.  Apply the CHG Soap to your body ONLY FROM THE NECK DOWN.   Do   not use on face/ open                           Wound or open sores. Avoid contact with eyes, ears mouth and   genitals (private parts).                       Wash face,  Genitals (private parts) with your normal soap.             6.  Wash thoroughly, paying special attention to the area where your    surgery  will be performed.  7.  Thoroughly rinse your body with warm water from the neck down.  8.  DO NOT shower/wash with your normal soap after using and rinsing off the CHG Soap.                9.  Pat yourself dry with a clean towel.            10.  Wear clean pajamas.            11.  Place clean sheets on your bed the night of your first shower and do not  sleep with pets. Day of Surgery : Do not apply any lotions/deodorants the morning of surgery.  Please wear clean clothes to the hospital/surgery center.  FAILURE TO FOLLOW THESE INSTRUCTIONS MAY RESULT IN THE CANCELLATION OF YOUR SURGERY  PATIENT SIGNATURE_________________________________  NURSE SIGNATURE__________________________________  ________________________________________________________________________   Audrey Guerra  An incentive spirometer is a tool that can help keep your lungs clear and active. This tool measures how well you are filling your lungs with each breath. Taking long deep breaths may help reverse or decrease the chance of developing breathing (pulmonary) problems (especially infection) following: A long period of time when you are unable to move or be active. BEFORE THE PROCEDURE  If the spirometer includes an indicator to show your best effort, your nurse or respiratory therapist will set it to a  desired goal. If possible, sit up straight or lean slightly forward. Try not to slouch. Hold the incentive spirometer in an upright position. INSTRUCTIONS FOR USE  Sit on the edge of your bed if possible, or sit up as far as you can in bed or on a chair. Hold the  incentive spirometer in an upright position. Breathe out normally. Place the mouthpiece in your mouth and seal your lips tightly around it. Breathe in slowly and as deeply as possible, raising the piston or the ball toward the top of the column. Hold your breath for 3-5 seconds or for as long as possible. Allow the piston or ball to fall to the bottom of the column. Remove the mouthpiece from your mouth and breathe out normally. Rest for a few seconds and repeat Steps 1 through 7 at least 10 times every 1-2 hours when you are awake. Take your time and take a few normal breaths between deep breaths. The spirometer may include an indicator to show your best effort. Use the indicator as a goal to work toward during each repetition. After each set of 10 deep breaths, practice coughing to be sure your lungs are clear. If you have an incision (the cut made at the time of surgery), support your incision when coughing by placing a pillow or rolled up towels firmly against it. Once you are able to get out of bed, walk around indoors and cough well. You may stop using the incentive spirometer when instructed by your caregiver.  RISKS AND COMPLICATIONS Take your time so you do not get dizzy or light-headed. If you are in pain, you may need to take or ask for pain medication before doing incentive spirometry. It is harder to take a deep breath if you are having pain. AFTER USE Rest and breathe slowly and easily. It can be helpful to keep track of a log of your progress. Your caregiver can provide you with a simple table to help with this. If you are using the spirometer at home, follow these instructions: SEEK MEDICAL CARE IF:  You are having  difficultly using the spirometer. You have trouble using the spirometer as often as instructed. Your pain medication is not giving enough relief while using the spirometer. You develop fever of 100.5 F (38.1 C) or higher. SEEK IMMEDIATE MEDICAL CARE IF:  You cough up bloody sputum that had not been present before. You develop fever of 102 F (38.9 C) or greater. You develop worsening pain at or near the incision site. MAKE SURE YOU:  Understand these instructions. Will watch your condition. Will get help right away if you are not doing well or get worse. Document Released: 04/12/2007 Document Revised: 02/22/2012 Document Reviewed: 06/13/2007 ExitCare Patient Information 2014 ExitCare, [place].   ________________________________________________________________________  WHAT IS A BLOOD TRANSFUSION? Blood Transfusion Information  A transfusion is the replacement of blood or some of its parts. Blood is made up of multiple cells which provide different functions. Red blood cells carry oxygen and are used for blood loss replacement. White blood cells fight against infection. Platelets control bleeding. Plasma helps clot blood. Other blood products are available for specialized needs, such as hemophilia or other clotting disorders. BEFORE THE TRANSFUSION  Who gives blood for transfusions?  Healthy volunteers who are fully evaluated to make sure their blood is safe. This is blood bank blood. Transfusion therapy is the safest it has ever been in the practice of medicine. Before blood is taken from a donor, a complete history is taken to make sure that person has no history of diseases nor engages in risky social behavior (examples are intravenous drug use or sexual activity with multiple partners). The donor's travel history is screened to minimize risk of transmitting infections, such as malaria. The donated blood is tested for signs of  infectious diseases, such as HIV and hepatitis. The blood  is then tested to be sure it is compatible with you in order to minimize the chance of a transfusion reaction. If you or a relative donates blood, this is often done in anticipation of surgery and is not appropriate for emergency situations. It takes many days to process the donated blood. RISKS AND COMPLICATIONS Although transfusion therapy is very safe and saves many lives, the main dangers of transfusion include:  Getting an infectious disease. Developing a transfusion reaction. This is an allergic reaction to something in the blood you were given. Every precaution is taken to prevent this. The decision to have a blood transfusion has been considered carefully by your caregiver before blood is given. Blood is not given unless the benefits outweigh the risks. AFTER THE TRANSFUSION Right after receiving a blood transfusion, you will usually feel much better and more energetic. This is especially true if your red blood cells have gotten low (anemic). The transfusion raises the level of the red blood cells which carry oxygen, and this usually causes an energy increase. The nurse administering the transfusion will monitor you carefully for complications. HOME CARE INSTRUCTIONS  No special instructions are needed after a transfusion. You may find your energy is better. Speak with your caregiver about any limitations on activity for underlying diseases you may have. SEEK MEDICAL CARE IF:  Your condition is not improving after your transfusion. You develop redness or irritation at the intravenous (IV) site. SEEK IMMEDIATE MEDICAL CARE IF:  Any of the following symptoms occur over the next 12 hours: Shaking chills. You have a temperature by mouth above 102 F (38.9 C), not controlled by medicine. Chest, back, or muscle pain. People around you feel you are not acting correctly or are confused. Shortness of breath or difficulty breathing. Dizziness and fainting. You get a rash or develop hives. You  have a decrease in urine output. Your urine turns a dark color or changes to pink, red, or brown. Any of the following symptoms occur over the next 10 days: You have a temperature by mouth above 102 F (38.9 C), not controlled by medicine. Shortness of breath. Weakness after normal activity. The white part of the eye turns yellow (jaundice). You have a decrease in the amount of urine or are urinating less often. Your urine turns a dark color or changes to pink, red, or brown. Document Released: 11/27/2000 Document Revised: 02/22/2012 Document Reviewed: 07/16/2008 [facility] Patient Information 2014 [place], [place].  _______________________________________________________________________

## 2022-06-23 ENCOUNTER — Encounter (HOSPITAL_COMMUNITY): Payer: Self-pay

## 2022-06-23 ENCOUNTER — Ambulatory Visit (HOSPITAL_COMMUNITY)
Admission: RE | Admit: 2022-06-23 | Discharge: 2022-06-23 | Disposition: A | Discharge status: Home / Self Care | Payer: 59 | Source: Ambulatory Visit | Attending: Anesthesiology | Admitting: Anesthesiology

## 2022-06-23 ENCOUNTER — Encounter (HOSPITAL_COMMUNITY)
Admission: RE | Admit: 2022-06-23 | Discharge: 2022-06-23 | Disposition: A | Discharge status: Home / Self Care | Payer: 59 | Source: Ambulatory Visit | Attending: General Surgery | Admitting: General Surgery

## 2022-06-23 VITALS — BP 124/79 | HR 92 | Temp 97.8°F | Resp 14 | Ht 65.0 in | Wt 227.6 lb

## 2022-06-23 DIAGNOSIS — Z01818 Encounter for other preprocedural examination: Secondary | ICD-10-CM | POA: Insufficient documentation

## 2022-06-23 HISTORY — DX: Unspecified atrial fibrillation: I48.91

## 2022-06-23 LAB — CBC WITH DIFFERENTIAL/PLATELET
Abs Immature Granulocytes: 0.02 10*3/uL (ref 0.00–0.07)
Basophils Absolute: 0.1 10*3/uL (ref 0.0–0.1)
Basophils Relative: 1 %
Eosinophils Absolute: 0.2 10*3/uL (ref 0.0–0.5)
Eosinophils Relative: 3 %
HCT: 42.4 % (ref 36.0–46.0)
Hemoglobin: 14.1 g/dL (ref 12.0–15.0)
Immature Granulocytes: 0 %
Lymphocytes Relative: 28 %
Lymphs Abs: 1.8 10*3/uL (ref 0.7–4.0)
MCH: 30.3 pg (ref 26.0–34.0)
MCHC: 33.3 g/dL (ref 30.0–36.0)
MCV: 91 fL (ref 80.0–100.0)
Monocytes Absolute: 0.6 10*3/uL (ref 0.1–1.0)
Monocytes Relative: 9 %
Neutro Abs: 3.9 10*3/uL (ref 1.7–7.7)
Neutrophils Relative %: 59 %
Platelets: 269 10*3/uL (ref 150–400)
RBC: 4.66 MIL/uL (ref 3.87–5.11)
RDW: 12.8 % (ref 11.5–15.5)
WBC: 6.6 10*3/uL (ref 4.0–10.5)
nRBC: 0 % (ref 0.0–0.2)

## 2022-06-23 LAB — COMPREHENSIVE METABOLIC PANEL
ALT: 16 U/L (ref 0–44)
AST: 21 U/L (ref 15–41)
Albumin: 4.1 g/dL (ref 3.5–5.0)
Alkaline Phosphatase: 73 U/L (ref 38–126)
Anion gap: 6 (ref 5–15)
BUN: 21 mg/dL — ABNORMAL HIGH (ref 6–20)
CO2: 27 mmol/L (ref 22–32)
Calcium: 9.4 mg/dL (ref 8.9–10.3)
Chloride: 106 mmol/L (ref 98–111)
Creatinine, Ser: 0.82 mg/dL (ref 0.44–1.00)
GFR, Estimated: >60 mL/min (ref >60)
Glucose, Bld: 102 mg/dL — ABNORMAL HIGH (ref 70–99)
Potassium: 3.9 mmol/L (ref 3.5–5.1)
Sodium: 139 mmol/L (ref 135–145)
Total Bilirubin: 0.8 mg/dL (ref 0.3–1.2)
Total Protein: 8.1 g/dL (ref 6.5–8.1)

## 2022-06-24 NOTE — Anesthesia Preprocedure Evaluation (Addendum)
Anesthesia Evaluation  Patient identified by MRN, date of birth, ID band Patient awake    Reviewed: Allergy & Precautions, NPO status , Patient's Chart, lab work & pertinent test results, reviewed documented beta blocker date and time   History of Anesthesia Complications Negative for: history of anesthetic complications  Airway Mallampati: II  TM Distance: >3 FB Neck ROM: Full    Dental  (+) Dental Advisory Given   Pulmonary former smoker,    Pulmonary exam normal        Cardiovascular hypertension, Pt. on medications and Pt. on home beta blockers +CHF  Normal cardiovascular exam+ dysrhythmias Atrial Fibrillation    '21 TTE - EF 55-60%. Left ventricular filling pattern is prolonged relaxation. No significant valvulopathy     Neuro/Psych PSYCHIATRIC DISORDERS Anxiety negative neurological ROS     GI/Hepatic negative GI ROS, Neg liver ROS,   Endo/Other   Obesity   Renal/GU negative Renal ROS     Musculoskeletal  (+) Arthritis , Osteoarthritis,    Abdominal   Peds  Hematology  On xarelto    Anesthesia Other Findings   Reproductive/Obstetrics                           Anesthesia Physical Anesthesia Plan  ASA: 3  Anesthesia Plan: General   Post-op Pain Management: Tylenol PO (pre-op)* and Ketamine IV*   Induction: Intravenous  PONV Risk Score and Plan: 3 and Treatment may vary due to age or medical condition, Ondansetron, Dexamethasone, Midazolam and Scopolamine patch - Pre-op  Airway Management Planned: Oral ETT  Additional Equipment: None  Intra-op Plan:   Post-operative Plan: Extubation in OR  Informed Consent: I have reviewed the patients History and Physical, chart, labs and discussed the procedure including the risks, benefits and alternatives for the proposed anesthesia with the patient or authorized representative who has indicated his/her understanding and  acceptance.     Dental advisory given  Plan Discussed with: CRNA and Anesthesiologist  Anesthesia Plan Comments:       Anesthesia Quick Evaluation

## 2022-06-24 NOTE — Progress Notes (Signed)
Anesthesia Chart Review   Case: [id number] Date/Time: 06/30/22 1315   Procedures:      LAPAROSCOPIC SLEEVE GASTRECTOMY     UPPER GI ENDOSCOPY   Anesthesia type: General   Pre-op diagnosis: MORBID OBESITY   Location: [facility] ROOM 01 / [facility]   Surgeons: Kinsinger, De Blanch, MD       DISCUSSION:56 y.o. former smoker with h/o HTN, PTSD, paroxysmal atrial fibrillation (on Xarelto), HFpEF,NICM, morbid obesity scheduled for above procedure 06/30/2022 with Dr. Feliciana Rossetti.   Per cardiology preoperative evaluation 06/05/2022, "Chart reviewed as part of pre-operative protocol coverage. Patient was contacted 06/05/2022 in reference to pre-operative risk assessment for pending surgery as outlined below.  Audrey Guerra was last seen on 04/28/2022 by Gypsy Balsam NP.  Since that day, Audrey Guerra has done well without exertional chest pain or worsening dyspnea.   Therefore, based on ACC/AHA guidelines, the patient would be at acceptable risk for the planned procedure without further cardiovascular testing.    Patient was previously cleared for the same procedure in August 2022, however surgery has been delayed until now.  Dr. Elease Hashimoto recommended holding Xarelto for 2 to 3 days prior to the surgery and restart as soon as possible afterward at the surgeon's discretion."  Pt reports last dose of Xarelto 06/26/2022.   Anticipate pt can proceed with planned procedure barring acute status change.   VS: BP 124/79   Pulse 92   Temp 36.6 C (Oral)   Resp 14   Ht 5\' 5"  (1.651 m)   Wt 103.2 kg   SpO2 100%   BMI 37.87 kg/m   PROVIDERS: Tisovec, , MD is PCP   Primary Cardiologist: Adelfa Koh, MD LABS: Labs reviewed: Acceptable for surgery. (all labs ordered are listed, but only abnormal results are displayed)  Labs Reviewed  COMPREHENSIVE METABOLIC PANEL - Abnormal; Notable for the following components:      Result Value   Glucose, Bld 102 (*)    BUN 21 (*)    All other components  within normal limits  CBC WITH DIFFERENTIAL/PLATELET  TYPE AND SCREEN     IMAGES:   EKG: 07/14/2021 Rate 82 bpm  NSR Frequent PACs  CV: Echo 07/12/2020 SUMMARY  The left ventricular size is normal.  Left ventricular systolic function is normal.  LV ejection fraction = 55-60%.  Left ventricular filling pattern is prolonged relaxation.  The left ventricular wall motion is normal.  There is no significant valvular stenosis or regurgitation.  The aortic sinus is normal size.  There is no pericardial effusion.  There is no comparison study available.   Past Medical History:  Diagnosis Date   Atrial fibrillation (HCC)    BMI 40.0-44.9, adult (HCC)    Cardiomegaly    Hyperlipidemia    Hypertension    Morbid obesity (HCC)    Osteoarthritis    PTSD (post-traumatic stress disorder)     Past Surgical History:  Procedure Laterality Date   CESAREAN SECTION     2 previous c-sections    MEDICATIONS:  acidophilus (RISAQUAD) CAPS capsule   hydrochlorothiazide (HYDRODIURIL) 12.5 MG tablet   hydroxypropyl methylcellulose / hypromellose (ISOPTO TEARS / GONIOVISC) 2.5 % ophthalmic solution   Menthol, Topical Analgesic, (ICY HOT) 7.5 % (Roll) MISC   metoprolol tartrate (LOPRESSOR) 25 MG tablet   rivaroxaban (XARELTO) 20 MG TABS tablet   rosuvastatin (CRESTOR) 5 MG tablet   valsartan (DIOVAN) 80 MG tablet   No current facility-administered medications for this encounter.  Konrad Felix Ward, PA-C [facility] Pre-Surgical Testing [phone]

## 2022-06-29 NOTE — Discharge Instructions (Signed)

## 2022-06-30 ENCOUNTER — Other Ambulatory Visit: Payer: Self-pay

## 2022-06-30 ENCOUNTER — Inpatient Hospital Stay (HOSPITAL_COMMUNITY): Payer: 59 | Admitting: Anesthesiology

## 2022-06-30 ENCOUNTER — Encounter (HOSPITAL_COMMUNITY)
Admission: RE | Disposition: A | Discharge status: Home / Self Care | Payer: Self-pay | Source: Home / Self Care | Attending: General Surgery

## 2022-06-30 ENCOUNTER — Encounter (HOSPITAL_COMMUNITY): Payer: Self-pay | Admitting: General Surgery

## 2022-06-30 ENCOUNTER — Inpatient Hospital Stay (HOSPITAL_COMMUNITY): Payer: 59 | Admitting: Physician Assistant

## 2022-06-30 ENCOUNTER — Inpatient Hospital Stay (HOSPITAL_COMMUNITY)
Admission: RE | Admit: 2022-06-30 | Discharge: 2022-07-01 | DRG: 621 | Disposition: A | Discharge status: Home / Self Care | Payer: 59 | Attending: General Surgery | Admitting: General Surgery

## 2022-06-30 DIAGNOSIS — Z87891 Personal history of nicotine dependence: Secondary | ICD-10-CM | POA: Diagnosis not present

## 2022-06-30 DIAGNOSIS — I10 Essential (primary) hypertension: Secondary | ICD-10-CM | POA: Diagnosis present

## 2022-06-30 DIAGNOSIS — I110 Hypertensive heart disease with heart failure: Secondary | ICD-10-CM | POA: Diagnosis not present

## 2022-06-30 DIAGNOSIS — Z833 Family history of diabetes mellitus: Secondary | ICD-10-CM

## 2022-06-30 DIAGNOSIS — Z7901 Long term (current) use of anticoagulants: Secondary | ICD-10-CM | POA: Diagnosis not present

## 2022-06-30 DIAGNOSIS — Z6837 Body mass index (BMI) 37.0-37.9, adult: Secondary | ICD-10-CM

## 2022-06-30 DIAGNOSIS — I5032 Chronic diastolic (congestive) heart failure: Secondary | ICD-10-CM | POA: Diagnosis not present

## 2022-06-30 DIAGNOSIS — I480 Paroxysmal atrial fibrillation: Secondary | ICD-10-CM | POA: Diagnosis present

## 2022-06-30 DIAGNOSIS — Z8249 Family history of ischemic heart disease and other diseases of the circulatory system: Secondary | ICD-10-CM | POA: Diagnosis not present

## 2022-06-30 DIAGNOSIS — E785 Hyperlipidemia, unspecified: Secondary | ICD-10-CM | POA: Diagnosis present

## 2022-06-30 DIAGNOSIS — Z79899 Other long term (current) drug therapy: Secondary | ICD-10-CM | POA: Diagnosis not present

## 2022-06-30 DIAGNOSIS — F17210 Nicotine dependence, cigarettes, uncomplicated: Secondary | ICD-10-CM | POA: Diagnosis present

## 2022-06-30 DIAGNOSIS — E66812 Obesity, class 2: Secondary | ICD-10-CM | POA: Diagnosis present

## 2022-06-30 DIAGNOSIS — E669 Obesity, unspecified: Principal | ICD-10-CM | POA: Diagnosis present

## 2022-06-30 HISTORY — PX: UPPER GI ENDOSCOPY: SHX6162

## 2022-06-30 HISTORY — PX: LAPAROSCOPIC GASTRIC SLEEVE RESECTION: SHX5895

## 2022-06-30 LAB — CBC
HCT: 40.4 % (ref 36.0–46.0)
Hemoglobin: 13.4 g/dL (ref 12.0–15.0)
MCH: 30.4 pg (ref 26.0–34.0)
MCHC: 33.2 g/dL (ref 30.0–36.0)
MCV: 91.6 fL (ref 80.0–100.0)
Platelets: 244 10*3/uL (ref 150–400)
RBC: 4.41 MIL/uL (ref 3.87–5.11)
RDW: 12.5 % (ref 11.5–15.5)
WBC: 14.8 10*3/uL — ABNORMAL HIGH (ref 4.0–10.5)
nRBC: 0 % (ref 0.0–0.2)

## 2022-06-30 LAB — TYPE AND SCREEN
ABO/RH(D): B POS
Antibody Screen: NEGATIVE

## 2022-06-30 LAB — CREATININE, SERUM
Creatinine, Ser: 0.9 mg/dL (ref 0.44–1.00)
GFR, Estimated: >60 mL/min (ref >60)

## 2022-06-30 LAB — ABO/RH: ABO/RH(D): B POS

## 2022-06-30 LAB — PREGNANCY, URINE: Preg Test, Ur: NEGATIVE

## 2022-06-30 SURGERY — GASTRECTOMY, SLEEVE, LAPAROSCOPIC
Anesthesia: General

## 2022-06-30 MED ORDER — CHLORHEXIDINE GLUCONATE 0.12 % MT SOLN
15.0000 mL | Freq: Once | OROMUCOSAL | Status: AC
  Administered 2022-06-30: 15 mL via OROMUCOSAL

## 2022-06-30 MED ORDER — BUPIVACAINE LIPOSOME 1.3 % IJ SUSP
INTRAMUSCULAR | Status: DC | PRN
  Administered 2022-06-30: 20 mL

## 2022-06-30 MED ORDER — PROPOFOL 10 MG/ML IV BOLUS
INTRAVENOUS | Status: DC | PRN
  Administered 2022-06-30: 160 mg via INTRAVENOUS

## 2022-06-30 MED ORDER — ONDANSETRON HCL 4 MG/2ML IJ SOLN
INTRAMUSCULAR | Status: DC | PRN
  Administered 2022-06-30: 4 mg via INTRAVENOUS

## 2022-06-30 MED ORDER — SCOPOLAMINE 1 MG/3DAYS TD PT72
1.0000 | MEDICATED_PATCH | TRANSDERMAL | Status: DC
  Administered 2022-06-30: 1.5 mg via TRANSDERMAL
  Filled 2022-06-30: qty 1

## 2022-06-30 MED ORDER — ENOXAPARIN SODIUM 30 MG/0.3ML IJ SOSY
30.0000 mg | PREFILLED_SYRINGE | Freq: Two times a day (BID) | INTRAMUSCULAR | Status: DC
  Administered 2022-06-30 – 2022-07-01 (×2): 30 mg via SUBCUTANEOUS
  Filled 2022-06-30 (×2): qty 0.3

## 2022-06-30 MED ORDER — HYDRALAZINE HCL 20 MG/ML IJ SOLN: 10.0000 mg | INTRAMUSCULAR | Status: DC | PRN

## 2022-06-30 MED ORDER — ORAL CARE MOUTH RINSE: 15.0000 mL | Freq: Once | OROMUCOSAL | Status: AC

## 2022-06-30 MED ORDER — BUPIVACAINE HCL 0.25 % IJ SOLN
INTRAMUSCULAR | Status: DC | PRN
  Administered 2022-06-30: 30 mL

## 2022-06-30 MED ORDER — BUPIVACAINE LIPOSOME 1.3 % IJ SUSP: 20.0000 mL | Freq: Once | INTRAMUSCULAR | Status: DC

## 2022-06-30 MED ORDER — MORPHINE SULFATE (PF) 2 MG/ML IV SOLN
1.0000 mg | INTRAVENOUS | Status: DC | PRN
  Administered 2022-06-30: 2 mg via INTRAVENOUS
  Filled 2022-06-30: qty 1

## 2022-06-30 MED ORDER — KETAMINE HCL 10 MG/ML IJ SOLN
INTRAMUSCULAR | Status: DC | PRN
  Administered 2022-06-30: 30 mg via INTRAVENOUS

## 2022-06-30 MED ORDER — DEXAMETHASONE SODIUM PHOSPHATE 4 MG/ML IJ SOLN: 4.0000 mg | INTRAMUSCULAR | Status: DC

## 2022-06-30 MED ORDER — ENSURE MAX PROTEIN PO LIQD
2.0000 [oz_av] | ORAL | Status: DC
  Administered 2022-07-01 (×3): 2 [oz_av] via ORAL

## 2022-06-30 MED ORDER — EPHEDRINE SULFATE-NACL 50-0.9 MG/10ML-% IV SOSY
PREFILLED_SYRINGE | INTRAVENOUS | Status: DC | PRN
  Administered 2022-06-30: 15 mg via INTRAVENOUS
  Administered 2022-06-30: 10 mg via INTRAVENOUS

## 2022-06-30 MED ORDER — PROPOFOL 10 MG/ML IV BOLUS
INTRAVENOUS | Status: AC
  Filled 2022-06-30: qty 20

## 2022-06-30 MED ORDER — ONDANSETRON HCL 4 MG/2ML IJ SOLN: 4.0000 mg | INTRAMUSCULAR | Status: DC | PRN

## 2022-06-30 MED ORDER — FENTANYL CITRATE (PF) 100 MCG/2ML IJ SOLN
INTRAMUSCULAR | Status: DC | PRN
  Administered 2022-06-30: 100 ug via INTRAVENOUS
  Administered 2022-06-30: 50 ug via INTRAVENOUS

## 2022-06-30 MED ORDER — OXYCODONE HCL 5 MG PO TABS
5.0000 mg | ORAL_TABLET | Freq: Once | ORAL | Status: AC | PRN
  Administered 2022-06-30: 5 mg via ORAL

## 2022-06-30 MED ORDER — HEPARIN SODIUM (PORCINE) 5000 UNIT/ML IJ SOLN
5000.0000 [IU] | INTRAMUSCULAR | Status: AC
  Administered 2022-06-30: 5000 [IU] via SUBCUTANEOUS
  Filled 2022-06-30: qty 1

## 2022-06-30 MED ORDER — 0.9 % SODIUM CHLORIDE (POUR BTL) OPTIME
TOPICAL | Status: DC | PRN
  Administered 2022-06-30: 1000 mL

## 2022-06-30 MED ORDER — SIMETHICONE 80 MG PO CHEW: 80.0000 mg | CHEWABLE_TABLET | Freq: Four times a day (QID) | ORAL | Status: DC | PRN

## 2022-06-30 MED ORDER — METOPROLOL TARTRATE 25 MG PO TABS
25.0000 mg | ORAL_TABLET | Freq: Two times a day (BID) | ORAL | Status: DC
  Administered 2022-06-30 – 2022-07-01 (×2): 25 mg via ORAL
  Filled 2022-06-30 (×2): qty 1

## 2022-06-30 MED ORDER — FENTANYL CITRATE PF 50 MCG/ML IJ SOSY
PREFILLED_SYRINGE | INTRAMUSCULAR | Status: AC
  Administered 2022-06-30: 50 ug via INTRAVENOUS
  Filled 2022-06-30: qty 2

## 2022-06-30 MED ORDER — MIDAZOLAM HCL 2 MG/2ML IJ SOLN
INTRAMUSCULAR | Status: AC
Start: 2022-06-30 — End: ?
  Filled 2022-06-30: qty 2

## 2022-06-30 MED ORDER — OXYCODONE HCL 5 MG/5ML PO SOLN: 5.0000 mg | Freq: Four times a day (QID) | ORAL | Status: DC | PRN

## 2022-06-30 MED ORDER — SODIUM CHLORIDE 0.9 % IV SOLN
2.0000 g | INTRAVENOUS | Status: AC
  Administered 2022-06-30: 2 g via INTRAVENOUS
  Filled 2022-06-30: qty 2

## 2022-06-30 MED ORDER — SUGAMMADEX SODIUM 200 MG/2ML IV SOLN
INTRAVENOUS | Status: DC | PRN
  Administered 2022-06-30: 200 mg via INTRAVENOUS

## 2022-06-30 MED ORDER — FENTANYL CITRATE PF 50 MCG/ML IJ SOSY
25.0000 ug | PREFILLED_SYRINGE | INTRAMUSCULAR | Status: DC | PRN
  Administered 2022-06-30 (×2): 50 ug via INTRAVENOUS

## 2022-06-30 MED ORDER — ACETAMINOPHEN 500 MG PO TABS
1000.0000 mg | ORAL_TABLET | ORAL | Status: AC
  Administered 2022-06-30: 1000 mg via ORAL
  Filled 2022-06-30: qty 2

## 2022-06-30 MED ORDER — APREPITANT 40 MG PO CAPS
40.0000 mg | ORAL_CAPSULE | ORAL | Status: AC
  Administered 2022-06-30: 40 mg via ORAL
  Filled 2022-06-30: qty 1

## 2022-06-30 MED ORDER — ACETAMINOPHEN 500 MG PO TABS
1000.0000 mg | ORAL_TABLET | Freq: Three times a day (TID) | ORAL | Status: DC
  Administered 2022-06-30 – 2022-07-01 (×2): 1000 mg via ORAL
  Filled 2022-06-30 (×2): qty 2

## 2022-06-30 MED ORDER — BUPIVACAINE LIPOSOME 1.3 % IJ SUSP
INTRAMUSCULAR | Status: AC
  Filled 2022-06-30: qty 20

## 2022-06-30 MED ORDER — CHLORHEXIDINE GLUCONATE CLOTH 2 % EX PADS: 6.0000 | MEDICATED_PAD | Freq: Once | CUTANEOUS | Status: DC

## 2022-06-30 MED ORDER — ONDANSETRON HCL 4 MG/2ML IJ SOLN: 4.0000 mg | Freq: Once | INTRAMUSCULAR | Status: DC | PRN

## 2022-06-30 MED ORDER — BUPIVACAINE HCL 0.25 % IJ SOLN
INTRAMUSCULAR | Status: AC
  Filled 2022-06-30: qty 1

## 2022-06-30 MED ORDER — ROCURONIUM BROMIDE 10 MG/ML (PF) SYRINGE
PREFILLED_SYRINGE | INTRAVENOUS | Status: DC | PRN
  Administered 2022-06-30: 60 mg via INTRAVENOUS

## 2022-06-30 MED ORDER — LIDOCAINE 2% (20 MG/ML) 5 ML SYRINGE
INTRAMUSCULAR | Status: DC | PRN
  Administered 2022-06-30: 1.5 mg/kg/h via INTRAVENOUS
  Administered 2022-06-30: 80 mg via INTRAVENOUS

## 2022-06-30 MED ORDER — MIDAZOLAM HCL 5 MG/5ML IJ SOLN
INTRAMUSCULAR | Status: DC | PRN
  Administered 2022-06-30: 2 mg via INTRAVENOUS

## 2022-06-30 MED ORDER — DEXAMETHASONE SODIUM PHOSPHATE 10 MG/ML IJ SOLN
INTRAMUSCULAR | Status: DC | PRN
  Administered 2022-06-30: 10 mg via INTRAVENOUS

## 2022-06-30 MED ORDER — LACTATED RINGERS IR SOLN
Status: DC | PRN
  Administered 2022-06-30: 1000 mL

## 2022-06-30 MED ORDER — OXYCODONE HCL 5 MG PO TABS
ORAL_TABLET | ORAL | Status: AC
  Filled 2022-06-30: qty 1

## 2022-06-30 MED ORDER — ACETAMINOPHEN 160 MG/5ML PO SOLN: 1000.0000 mg | Freq: Three times a day (TID) | ORAL | Status: DC

## 2022-06-30 MED ORDER — FENTANYL CITRATE PF 50 MCG/ML IJ SOSY
PREFILLED_SYRINGE | INTRAMUSCULAR | Status: AC
  Filled 2022-06-30: qty 1

## 2022-06-30 MED ORDER — ONDANSETRON HCL 4 MG/2ML IJ SOLN
INTRAMUSCULAR | Status: AC
  Filled 2022-06-30: qty 2

## 2022-06-30 MED ORDER — FENTANYL CITRATE (PF) 250 MCG/5ML IJ SOLN
INTRAMUSCULAR | Status: AC
  Filled 2022-06-30: qty 5

## 2022-06-30 MED ORDER — FAMOTIDINE IN NACL 20-0.9 MG/50ML-% IV SOLN
20.0000 mg | Freq: Two times a day (BID) | INTRAVENOUS | Status: DC
  Administered 2022-06-30: 20 mg via INTRAVENOUS
  Filled 2022-06-30 (×2): qty 50

## 2022-06-30 MED ORDER — OXYCODONE HCL 5 MG/5ML PO SOLN: 5.0000 mg | Freq: Once | ORAL | Status: AC | PRN

## 2022-06-30 MED ORDER — DEXTROSE-NACL 5-0.45 % IV SOLN: INTRAVENOUS | Status: DC

## 2022-06-30 MED ORDER — PHENYLEPHRINE 80 MCG/ML (10ML) SYRINGE FOR IV PUSH (FOR BLOOD PRESSURE SUPPORT)
PREFILLED_SYRINGE | INTRAVENOUS | Status: DC | PRN
  Administered 2022-06-30: 80 ug via INTRAVENOUS
  Administered 2022-06-30 (×2): 160 ug via INTRAVENOUS
  Administered 2022-06-30: 80 ug via INTRAVENOUS
  Administered 2022-06-30: 160 ug via INTRAVENOUS

## 2022-06-30 MED ORDER — LACTATED RINGERS IV SOLN: INTRAVENOUS | Status: DC

## 2022-06-30 SURGICAL SUPPLY — 68 items
APL PRP STRL LF DISP 70% ISPRP (MISCELLANEOUS) ×2
APL SKNCLS STERI-STRIP NONHPOA (GAUZE/BANDAGES/DRESSINGS) ×1
APPLIER CLIP ROT 13.4 12 LRG (CLIP)
APR CLP LRG 13.4X12 ROT 20 MLT (CLIP)
BAG COUNTER SPONGE SURGICOUNT (BAG) IMPLANT
BAG LAPAROSCOPIC 12 15 PORT 16 (BASKET) ×2 IMPLANT
BAG RETRIEVAL 12/15 (BASKET) ×2
BAG SPNG CNTER NS LX DISP (BAG)
BENZOIN TINCTURE PRP APPL 2/3 (GAUZE/BANDAGES/DRESSINGS) ×3 IMPLANT
BLADE SURG SZ11 CARB STEEL (BLADE) ×3 IMPLANT
BNDG ADH 1X3 SHEER STRL LF (GAUZE/BANDAGES/DRESSINGS) ×18 IMPLANT
BNDG ADH THN 3X1 STRL LF (GAUZE/BANDAGES/DRESSINGS) ×6
CABLE HIGH FREQUENCY MONO STRZ (ELECTRODE) IMPLANT
CHLORAPREP W/TINT 26 (MISCELLANEOUS) ×4 IMPLANT
CLIP APPLIE ROT 13.4 12 LRG (CLIP) IMPLANT
COVER SURGICAL LIGHT HANDLE (MISCELLANEOUS) ×3 IMPLANT
DRAPE UTILITY XL STRL (DRAPES) ×6 IMPLANT
ELECT REM PT RETURN 15FT ADLT (MISCELLANEOUS) ×3 IMPLANT
GAUZE 4X4 16PLY ~~LOC~~+RFID DBL (SPONGE) ×3 IMPLANT
GLOVE BIOGEL PI IND STRL 7.0 (GLOVE) ×2 IMPLANT
GLOVE BIOGEL PI INDICATOR 7.0 (GLOVE) ×1
GLOVE SURG SS PI 7.0 STRL IVOR (GLOVE) ×3 IMPLANT
GOWN STRL REUS W/ TWL LRG LVL3 (GOWN DISPOSABLE) ×2 IMPLANT
GOWN STRL REUS W/ TWL XL LVL3 (GOWN DISPOSABLE) IMPLANT
GOWN STRL REUS W/TWL LRG LVL3 (GOWN DISPOSABLE) ×2
GOWN STRL REUS W/TWL XL LVL3 (GOWN DISPOSABLE)
GRASPER SUT TROCAR 14GX15 (MISCELLANEOUS) ×3 IMPLANT
IRRIG SUCT STRYKERFLOW 2 WTIP (MISCELLANEOUS) ×2
IRRIGATION SUCT STRKRFLW 2 WTP (MISCELLANEOUS) ×2 IMPLANT
KIT BASIN OR (CUSTOM PROCEDURE TRAY) ×3 IMPLANT
KIT PROCEDURE OLYMPUS (MISCELLANEOUS) ×1 IMPLANT
KIT TURNOVER KIT A (KITS) IMPLANT
MARKER SKIN DUAL TIP RULER LAB (MISCELLANEOUS) ×3 IMPLANT
MAT PREVALON FULL STRYKER (MISCELLANEOUS) IMPLANT
NDL SPNL 22GX3.5 QUINCKE BK (NEEDLE) ×2 IMPLANT
NEEDLE SPNL 22GX3.5 QUINCKE BK (NEEDLE) ×2 IMPLANT
PACK UNIVERSAL I (CUSTOM PROCEDURE TRAY) ×3 IMPLANT
RELOAD STAPLE 60 3.6 BLU REG (STAPLE) IMPLANT
RELOAD STAPLE 60 3.8 GOLD REG (STAPLE) IMPLANT
RELOAD STAPLE 60 4.1 GRN THCK (STAPLE) IMPLANT
RELOAD STAPLER BLUE 60MM (STAPLE) ×5 IMPLANT
RELOAD STAPLER GOLD 60MM (STAPLE) ×2 IMPLANT
RELOAD STAPLER GREEN 60MM (STAPLE) IMPLANT
SCISSORS LAP 5X45 EPIX DISP (ENDOMECHANICALS) IMPLANT
SET TUBE SMOKE EVAC HIGH FLOW (TUBING) ×3 IMPLANT
SHEARS HARMONIC ACE PLUS 45CM (MISCELLANEOUS) ×3 IMPLANT
SLEEVE GASTRECTOMY 40FR VISIGI (MISCELLANEOUS) ×3 IMPLANT
SLEEVE Z-THREAD 5X100MM (TROCAR) ×6 IMPLANT
SOL ANTI FOG 6CC (MISCELLANEOUS) ×2 IMPLANT
SOLUTION ANTI FOG 6CC (MISCELLANEOUS) ×1
SPIKE FLUID TRANSFER (MISCELLANEOUS) ×2 IMPLANT
STAPLER ECHELON LONG 60 440 (INSTRUMENTS) ×3 IMPLANT
STAPLER RELOAD BLUE 60MM (STAPLE) ×10
STAPLER RELOAD GOLD 60MM (STAPLE) ×4
STAPLER RELOAD GREEN 60MM (STAPLE)
STRIP CLOSURE SKIN 1/2X4 (GAUZE/BANDAGES/DRESSINGS) ×3 IMPLANT
SUT ETHIBOND 0 36 GRN (SUTURE) IMPLANT
SUT MNCRL AB 4-0 PS2 18 (SUTURE) ×3 IMPLANT
SUT VICRYL 0 TIES 12 18 (SUTURE) ×3 IMPLANT
SYR 20ML LL LF (SYRINGE) ×3 IMPLANT
SYR 50ML LL SCALE MARK (SYRINGE) ×3 IMPLANT
SYS KII OPTICAL ACCESS 15MM (TROCAR) ×2
SYSTEM KII OPTICAL ACCESS 15MM (TROCAR) ×2 IMPLANT
TOWEL OR 17X26 10 PK STRL BLUE (TOWEL DISPOSABLE) ×3 IMPLANT
TOWEL OR NON WOVEN STRL DISP B (DISPOSABLE) ×3 IMPLANT
TROCAR Z-THREAD OPTICAL 5X100M (TROCAR) ×3 IMPLANT
TUBING CONNECTING 10 (TUBING) ×6 IMPLANT
TUBING ENDO SMARTCAP (MISCELLANEOUS) ×1 IMPLANT

## 2022-06-30 NOTE — Anesthesia Postprocedure Evaluation (Signed)
Anesthesia Post Note  Patient: Audrey Guerra  Procedure(s) Performed: LAPAROSCOPIC SLEEVE GASTRECTOMY UPPER GI ENDOSCOPY     Patient location during evaluation: PACU Anesthesia Type: General Level of consciousness: awake and alert Pain management: pain level controlled Vital Signs Assessment: post-procedure vital signs reviewed and stable Respiratory status: spontaneous breathing, nonlabored ventilation, respiratory function stable and patient connected to nasal cannula oxygen Cardiovascular status: blood pressure returned to baseline and stable Postop Assessment: no apparent nausea or vomiting Anesthetic complications: no   No notable events documented.  Last Vitals:  Vitals:   06/30/22 1445 06/30/22 1500  BP: (!) 146/74 (!) 143/95  Pulse: 70 (!) 55  Resp: 18 10  Temp:    SpO2: 100% 100%    Last Pain:  Vitals:   06/30/22 1500  TempSrc:   PainSc: 5                  Beryle Lathe

## 2022-06-30 NOTE — Progress Notes (Signed)
Unable to start water as ordered, patient lethargic and in pain, requiring IV medication

## 2022-06-30 NOTE — Op Note (Signed)
Preop Diagnosis: Obesity Class II with major comorbidity  Postop Diagnosis: same  Procedure performed: laparoscopic Sleeve Gastrectomy  Assitant: Wenda Low  Indications:  The patient is a 56 y.o. year-old morbidly obese female who has been followed in the Bariatric Clinic as an outpatient. This patient was diagnosed with morbid obesity with a BMI of Body mass index is 37.87 kg/m. and significant co-morbidities including hypertension.  The patient was counseled extensively in the Bariatric Outpatient Clinic and after a thorough explanation of the risks and benefits of surgery (including death from complications, bowel leak, infection such as peritonitis and/or sepsis, internal hernia, bleeding, need for blood transfusion, bowel obstruction, organ failure, pulmonary embolus, deep venous thrombosis, wound infection, incisional hernia, skin breakdown, and others entailed on the consent form) and after a compliant diet and exercise program, the patient was scheduled for an elective laparoscopic sleeve gastrectomy.  Description of Operation:  Following informed consent, the patient was taken to the operating room and placed on the operating table in the supine position.  She had previously received prophylactic antibiotics and subcutaneous heparin for DVT prophylaxis in the pre-op holding area.  After induction of general endotracheal anesthesia by the anesthesiologist, the patient underwent placement of sequential compression devices and an oro-gastric tube.  A timeout was confirmed by the surgery and anesthesia teams.  The patient was adequately padded at all pressure points and placed on a footboard to prevent slippage from the OR table during extremes of position during surgery.  She underwent a routine sterile prep and drape of her entire abdomen.    Next, A transverse incision was made under the left subcostal area and a 24mm optical viewing trocar was introduced into the peritoneal cavity.  Pneumoperitoneum was applied with a high flow and low pressure. A laparoscope was inserted to confirm placement. A extraperitoneal block was then placed at the lateral abdominal wall using exparel diluted with marcaine. 5 additional incisions were placed: 1 63mm trocar to the left of the midline. 1 additional 68mm trocar in the left lateral area, 1 22mm trocar in the right mid abdomen, 1 69mm trocar in the right subcostal area, and a Nathanson retractor was placed through a subxiphoid incision.  Next, a hole was created through the lesser omentum along the greater curve of the stomach to enter the lesser sac. The vessels along the greater omentum were  Then ligated and divided using the Harmonic scalpel moving towards the spleen and then short gastric vessels were ligated and divided in the same fashion to fully mobilize the fundus. The left crus was identified to ensure completion of the dissection. Next the antrum was measured and dissection continued inferiorly along the greater curve towards the pylorus and stopped 6cm from the pylorus.   A 40Fr ViSiGi dilator was placed into the esophgaus and along the lesser curve of the stomach and placed on suction. 1 16mm Gold load echelon stapler(s) followed by 4 30mm blue load echelon stapler(s) were used to make the resection along the antrum being sure to stay well away from the angularis by angling the jaws of the stapler towards the greater curve and later completing the resection staying along the ViSiGi and ensuring the fundus was not retained by appropriately retracting it lateral. Air was inserted through the ViSiGi to perform a leak test showing no bubbles and a neutral lie of the stomach.  The assistant then went and performed an upper endoscopy and leak test.  No bubbles were seen and the sleeve and  antrum distended appropriately. The specimen was then placed in an endocatch bag and removed by the 60mm port. The fascia of the 81mm port was closed with a 0  vicryl by suture passer. Hemostasis was ensured. Pneumoperitoneum was evacuated, all ports were removed and all incisions closed with 4-0 monocryl suture in subcuticular fashion. Steristrips and bandaids were put in place for dressing. The patient awoke from anesthesia and was brought to pacu in stable condition. All counts were correct.  Estimated blood loss: <65ml  Specimens:  Sleeve gastrectomy  Local Anesthesia: 50 ml Exparel:0.5% Marcaine mix  Post-Op Plan:       Pain Management: PO, prn      Antibiotics: Prophylactic      Anticoagulation: Prophylactic, Starting now      Post Op Studies/Consults: Not applicable      Intended Discharge: within 48h      Intended Outpatient Follow-Up: Two Week      Intended Outpatient Studies: Not Applicable      Other: Not Applicable  De Blanch Rumor Sun

## 2022-06-30 NOTE — Progress Notes (Signed)
Pacu RN Report to floor given  Gave report to  [facility]. [id number]    Room: Discussed surgery, meds given in OR and Pacu, VS, IV fluids given, EBL, urine output, pain and other pertinent information. Also discussed if pt had any family or friends here or belongings with them.   Discussed 6 lapsites, VSS, pain med Oxycodone 5mg  given at 1605. She has been a bit chilly 97.3 now.   Pt exits my care.

## 2022-06-30 NOTE — H&P (Signed)
Chief Complaint: Bariatric Pre-op visit  History of Present Illness: Audrey Guerra is a 56 y.o. female who is seen today for sleeve gastrectomy prior discussion.  She has completed all requirements. She is ready to proceed with surgery. She has made significant improvements and is ready to proceed with sleeve gastrectomy.  Review of Systems: A complete review of systems was obtained from the patient. I have reviewed this information and discussed as appropriate with the patient. See HPI as well for other ROS.  Review of Systems  Constitutional: Negative.  HENT: Negative.  Eyes: Negative.  Respiratory: Negative.  Cardiovascular: Negative.  Gastrointestinal: Negative.  Genitourinary: Negative.  Musculoskeletal: Negative.  Skin: Negative.  Neurological: Negative.  Endo/Heme/Allergies: Negative.  Psychiatric/Behavioral: Negative.   Medical History: Past Medical History:  Diagnosis Date  CHF (congestive heart failure) (CMS-HCC)  Hyperlipidemia   There is no problem list on file for this patient.  Past Surgical History:  Procedure Laterality Date  CESAREAN SECTION 1986  REPEAT CESAREAN SECTION 1990   No Known Allergies  Current Outpatient Medications on File Prior to Visit  Medication Sig Dispense Refill  hydroCHLOROthiazide (HYDRODIURIL) 12.5 MG tablet Take 12.5 mg by mouth every morning  rivaroxaban (XARELTO) 20 mg tablet Take 20 mg by mouth once daily  valsartan (DIOVAN) 80 MG tablet Take 80 mg by mouth once daily  metoprolol tartrate (LOPRESSOR) 25 MG tablet Take 25 mg by mouth 2 (two) times daily with meals  rosuvastatin (CRESTOR) 5 MG tablet Take 5 mg by mouth once daily   No current facility-administered medications on file prior to visit.   Family History  Problem Relation Age of Onset  Hyperlipidemia (Elevated cholesterol) Mother  High blood pressure (Hypertension) Mother  Diabetes Mother  High blood pressure (Hypertension) Father  Hyperlipidemia (Elevated  cholesterol) Father  Diabetes Father   Social History   Tobacco Use  Smoking Status Every Day  Types: Cigarettes  Smokeless Tobacco Never    Social History   Socioeconomic History  Marital status: Single  Tobacco Use  Smoking status: Every Day  Types: Cigarettes  Smokeless tobacco: Never  Substance and Sexual Activity  Alcohol use: Never  Drug use: Never   Objective:   Vitals:  06/04/22 0930  BP: 128/76  Pulse: 76  Temp: 36.8 C (98.2 F)  SpO2: 99%  Weight: (!) 105.4 kg (232 lb 6.4 oz)  Height: 162.6 cm (5\' 4" )   Body mass index is 39.89 kg/m.  Physical Exam Constitutional:  Appearance: Normal appearance.  HENT:  Head: Normocephalic and atraumatic.  Pulmonary:  Effort: Pulmonary effort is normal.  Musculoskeletal:  General: Normal range of motion.  Cervical back: Normal range of motion.  Neurological:  General: No focal deficit present.  Mental Status: She is alert and oriented to person, place, and time. Mental status is at baseline.  Psychiatric:  Mood and Affect: Mood normal.  Behavior: Behavior normal.  Thought Content: Thought content normal.   Labs, Imaging and Diagnostic Testing: I reviewed labs in [employer] from 05/2021  Assessment and Plan:   Diagnoses and all orders for this visit:  Class 2 severe obesity with body mass index (BMI) of 35 to 39.9 with serious comorbidity (CMS-HCC)  AF (paroxysmal atrial fibrillation) (CMS-HCC)  Nicotine use disorder - Nicotine & Metabolites, Urine   The patient meets weight loss surgery criteria. Due to the above reasons, I think minimally invasive vertical sleeve gastrectomy is the best option for the patient.   We discussed sleeve gastrectomy. We discussed the preoperative, operative  and postoperative process. I explained the surgery in detail including the performance of an EGD near the end of the surgery to test for leak. We discussed the typical hospital course including a 1-2 day stay baring any  complications. The patient was given educational material. I quoted the patient that most patients can lose up to 50-70% of their excess weight. We did discuss the possibility of weight regain several years after the procedure.   The risks of infection, bleeding, pain, scarring, weight regain, too little or too much weight loss, vitamin deficiencies and need for lifelong vitamin supplementation, hair loss, need for protein supplementation, leaks, stricture, reflux, food intolerance, gallstone formation, hernia, need for reoperation, need for open surgery, injury to spleen or surrounding structures, DVT's, PE, and death again discussed with the patient and the patient expressed understanding and desires to proceed with minimally invasive sleeve gastrectomy, possible open, intraoperative endoscopy.  We discussed that before and after surgery that there would be an alteration in their diet. I explained that we may put them on a diet 2 weeks before surgery. I also explained that they would be on a liquid diet for 2 weeks after surgery. We discussed that they would have to avoid certain foods after surgery. We discussed the importance of physical activity as well as compliance with our dietary and supplement recommendations and routine follow-up.

## 2022-06-30 NOTE — Transfer of Care (Signed)
Immediate Anesthesia Transfer of Care Note  Patient: Audrey Guerra  Procedure(s) Performed: LAPAROSCOPIC SLEEVE GASTRECTOMY UPPER GI ENDOSCOPY  Patient Location: PACU  Anesthesia Type:General  Level of Consciousness: sedated, patient cooperative and responds to stimulation  Airway & Oxygen Therapy: Patient Spontanous Breathing and Patient connected to face mask oxygen  Post-op Assessment: Report given to RN and Post -op Vital signs reviewed and stable  Post vital signs: Reviewed and stable  Last Vitals:  Vitals Value Taken Time  BP    Temp    Pulse    Resp    SpO2      Last Pain:  Vitals:   06/30/22 1209  TempSrc:   PainSc: 0-No pain         Complications: No notable events documented.

## 2022-06-30 NOTE — Op Note (Signed)
Audrey Guerra [id number] [birth year] 06/30/2022  Preoperative diagnosis: sleeve gastrectomy in progress  Postoperative diagnosis: Same   Procedure: Upper endoscopy   Surgeon: Susy Frizzle B. Daphine Deutscher  M.D., FACS   Anesthesia: Gen.   Indications for procedure: This patient was undergoing a sleeve by Dr. Sheliah Hatch.    Description of procedure: The endoscopy was placed in the mouth and into the oropharynx and under endoscopic vision it was advanced to the esophagogastric junction.  The pouch was insufflated and the cylindrical sleeve was examined all the way to the antrum.  .   No bleeding or leaks were detected.  The scope was withdrawn without difficulty.     Matt B. Daphine Deutscher, MD, FACS General, Bariatric, & Minimally Invasive Surgery [facility], [place]

## 2022-06-30 NOTE — Anesthesia Procedure Notes (Signed)
Procedure Name: Intubation Date/Time: 06/30/2022 1:25 PM  Performed by: Kizzie Fantasia, CRNAPre-anesthesia Checklist: Patient identified, Emergency Drugs available, Suction available, Patient being monitored and Timeout performed Patient Re-evaluated:Patient Re-evaluated prior to induction Oxygen Delivery Method: Circle system utilized Preoxygenation: Pre-oxygenation with 100% oxygen Induction Type: IV induction Laryngoscope Size: 4 Grade View: Grade I Tube type: Oral Tube size: 7.0 mm Number of attempts: 1 Airway Equipment and Method: Stylet Placement Confirmation: ETT inserted through vocal cords under direct vision and breath sounds checked- equal and bilateral Secured at: 21 cm Tube secured with: Tape Dental Injury: Teeth and Oropharynx as per pre-operative assessment

## 2022-07-01 ENCOUNTER — Other Ambulatory Visit (HOSPITAL_COMMUNITY): Payer: Self-pay

## 2022-07-01 ENCOUNTER — Encounter (HOSPITAL_COMMUNITY): Payer: Self-pay | Admitting: General Surgery

## 2022-07-01 LAB — CBC WITH DIFFERENTIAL/PLATELET
Abs Immature Granulocytes: 0.04 10*3/uL (ref 0.00–0.07)
Basophils Absolute: 0 10*3/uL (ref 0.0–0.1)
Basophils Relative: 0 %
Eosinophils Absolute: 0 10*3/uL (ref 0.0–0.5)
Eosinophils Relative: 0 %
HCT: 38 % (ref 36.0–46.0)
Hemoglobin: 12.6 g/dL (ref 12.0–15.0)
Immature Granulocytes: 0 %
Lymphocytes Relative: 8 %
Lymphs Abs: 0.9 10*3/uL (ref 0.7–4.0)
MCH: 30.2 pg (ref 26.0–34.0)
MCHC: 33.2 g/dL (ref 30.0–36.0)
MCV: 91.1 fL (ref 80.0–100.0)
Monocytes Absolute: 0.6 10*3/uL (ref 0.1–1.0)
Monocytes Relative: 5 %
Neutro Abs: 10.5 10*3/uL — ABNORMAL HIGH (ref 1.7–7.7)
Neutrophils Relative %: 87 %
Platelets: 252 10*3/uL (ref 150–400)
RBC: 4.17 MIL/uL (ref 3.87–5.11)
RDW: 12.4 % (ref 11.5–15.5)
WBC: 12.1 10*3/uL — ABNORMAL HIGH (ref 4.0–10.5)
nRBC: 0 % (ref 0.0–0.2)

## 2022-07-01 LAB — SURGICAL PATHOLOGY

## 2022-07-01 MED ORDER — ONDANSETRON 4 MG PO TBDP
4.0000 mg | ORAL_TABLET | Freq: Four times a day (QID) | ORAL | 0 refills | Status: DC | PRN
  Filled 2022-07-01: qty 20, 5d supply, fill #0

## 2022-07-01 MED ORDER — ACETAMINOPHEN 500 MG PO TABS: 1000.0000 mg | ORAL_TABLET | Freq: Three times a day (TID) | ORAL | 0 refills | Status: DC

## 2022-07-01 MED ORDER — RIVAROXABAN 20 MG PO TABS
20.0000 mg | ORAL_TABLET | Freq: Every day | ORAL | 3 refills | Status: AC
  Filled 2022-07-01: qty 30, 30d supply, fill #0

## 2022-07-01 MED ORDER — PANTOPRAZOLE SODIUM 40 MG PO TBEC
40.0000 mg | DELAYED_RELEASE_TABLET | Freq: Every day | ORAL | 0 refills | Status: AC
  Filled 2022-07-01: qty 90, 90d supply, fill #0

## 2022-07-01 NOTE — Plan of Care (Signed)
  Problem: Education: Goal: Knowledge of General Education information will improve Description: Including pain rating scale, medication(s)/side effects and non-pharmacologic comfort measures Outcome: Progressing   Problem: Activity: Goal: Risk for activity intolerance will decrease Outcome: Progressing   Problem: Coping: Goal: Level of anxiety will decrease Outcome: Progressing   Problem: Elimination: Goal: Will not experience complications related to urinary retention Outcome: Progressing   

## 2022-07-01 NOTE — Progress Notes (Signed)
Patient seen in PACU after surgery.  Discussed QI "Goals for Discharge" document with patient including ambulation in halls, Incentive Spirometry use every hour, and oral care.  Also discussed pain and nausea control.  BSTOP education provided including BSTOP information guide, "Guide for Pain Management after your Bariatric Procedure".  Diet progression education provided including "Bariatric Surgery Post-Op Food Plan Phase 1: Liquids".  Questions answered.  Will continue to partner with bedside RN and follow up with patient per protocol after arrival to the floor.  

## 2022-07-01 NOTE — Progress Notes (Signed)
All discharge teaching provided by bariatric surgery coordinator. Discharge med rec reviewed by her as well. Patient discharged home via wheelchair with friend.

## 2022-07-01 NOTE — Progress Notes (Signed)
Transition of Care [facility]) Screening Note  Patient Details  Name: DAPHENE CHISHOLM Date of Birth: [birth year]  Transition of Care [facility]) CM/SW Contact:    Ewing Schlein, LCSW Phone Number: 07/01/2022, 10:45 AM  Transition of Care Department [facility]) has reviewed patient and no TOC needs have been identified at this time. We will continue to monitor patient advancement through interdisciplinary progression rounds. If new patient transition needs arise, please place a TOC consult.

## 2022-07-01 NOTE — Progress Notes (Signed)
24hr fluid recall prior to discharge: 600mL.  Per dehydration protocol, will call pt to f/u within one week post op. °

## 2022-07-01 NOTE — Progress Notes (Signed)
Patient alert and oriented, pain is controlled. Patient is tolerating fluids, advanced to protein shake today, patient is tolerating well.  Reviewed Gastric sleeve discharge instructions with patient and patient is able to articulate understanding.  Provided information on BELT program, Support Group and [facility] outpatient pharmacy. All questions answered, will continue to monitor.  

## 2022-07-01 NOTE — Discharge Summary (Signed)
Physician Discharge Summary  DIARRA CEJA ALP:[id number] DOB: 08/16/1966 DOA: 06/30/2022  PCP: Gaspar Garbe, MD  Admit date: 06/30/2022 Discharge date: 07/01/2022  Recommendations for Outpatient Follow-up:   (include homehealth, outpatient follow-up instructions, specific recommendations for PCP to follow-up on, etc.)   Follow-up Information     [facility]. Go on 07/14/2022.   Why: Please arrive 15 minutes prior to your pre-op class at 5:15pm Contact information: [address], [place], [place] [address]        Kalisi Bevill, De Blanch, MD. Go on 07/29/2022.   Specialty: General Surgery Why: Please arrive 15 minutes prior to your appointment at 9:10am. Thank you. Contact information: [address] [place] [address] [phone]         Marquasia Schmieder, De Blanch, MD. Go on 08/26/2022.   Specialty: General Surgery Why: Please arrive 15 minutes prior to your appointment at 9am. Thank you. Contact information: [address] [place] [address] [phone]                Discharge Diagnoses:  Principal Problem:   Class II obesity   Surgical Procedure: laparoscopic sleeve gastrectomy, upper endoscopy  Discharge Condition: Good Disposition: Home  Diet recommendation: Postoperative sleeve gastrectomy diet (liquids only)  Filed Weights   06/30/22 1139 06/30/22 1209  Weight: 103.5 kg 103.2 kg     Hospital Course:  The patient was admitted after undergoing laparoscopic sleeve gastrectomy. POD 0 she ambulated well. POD 1 she was started on the water diet protocol and tolerated 300 ml in the first shift. Once meeting the water amount she was advanced to bariatric protein shakes which they tolerated and were discharged home POD 1.  Treatments: surgery: laparoscopic sleeve gastrectomy  Discharge Instructions  Discharge Instructions     Ambulate hourly while awake   Complete by: As directed    Call MD for:  difficulty breathing,  headache or visual disturbances   Complete by: As directed    Call MD for:  persistant dizziness or light-headedness   Complete by: As directed    Call MD for:  persistant nausea and vomiting   Complete by: As directed    Call MD for:  redness, tenderness, or signs of infection (pain, swelling, redness, odor or green/yellow discharge around incision site)   Complete by: As directed    Call MD for:  severe uncontrolled pain   Complete by: As directed    Call MD for:  temperature >101 F   Complete by: As directed    Diet bariatric full liquid   Complete by: As directed    Discharge wound care:   Complete by: As directed    Remove Bandaids tomorrow, ok to shower tomorrow. Steristrips may fall off in 1-3 weeks.   Incentive spirometry   Complete by: As directed    Perform hourly while awake      Allergies as of 07/01/2022   No Known Allergies      Medication List     TAKE these medications    acetaminophen 500 MG tablet Commonly known as: TYLENOL Take 2 tablets (1,000 mg total) by mouth every 8 (eight) hours for 5 days.   acidophilus Caps capsule Take 1 capsule by mouth daily.   hydrochlorothiazide 12.5 MG tablet Commonly known as: HYDRODIURIL Take 12.5 mg by mouth every morning. Notes to patient: Monitor Blood Pressure Daily and keep a log for primary care physician.  Monitor for symptoms of dehydration.  You  may need to make changes to your medications with rapid weight loss.     hydroxypropyl methylcellulose / hypromellose 2.5 % ophthalmic solution Commonly known as: ISOPTO TEARS / GONIOVISC Place 1 drop into both eyes as needed for dry eyes.   Icy Hot 7.5 % (Roll) Misc Generic drug: Menthol (Topical Analgesic) Apply 1 each topically daily as needed (pain).   metoprolol tartrate 25 MG tablet Commonly known as: LOPRESSOR Take 25 mg by mouth in the morning and at bedtime. Notes to patient: Monitor Blood Pressure Daily and keep a log for primary care physician.  You  may need to make changes to your medications with rapid weight loss.     ondansetron 4 MG disintegrating tablet Commonly known as: ZOFRAN-ODT Take 1 tablet (4 mg total) by mouth every 6 (six) hours as needed for nausea or vomiting.   pantoprazole 40 MG tablet Commonly known as: PROTONIX Take 1 tablet (40 mg total) by mouth daily.   rivaroxaban 20 MG Tabs tablet Commonly known as: XARELTO Take 1 tablet (20 mg total) by mouth daily.   rosuvastatin 5 MG tablet Commonly known as: CRESTOR Take 5 mg by mouth daily.   valsartan 80 MG tablet Commonly known as: DIOVAN Take 80 mg by mouth daily. Notes to patient: Monitor Blood Pressure Daily and keep a log for primary care physician.  You may need to make changes to your medications with rapid weight loss.                 Discharge Care Instructions  (From admission, onward)           Start     Ordered   07/01/22 0000  Discharge wound care:       Comments: Remove Bandaids tomorrow, ok to shower tomorrow. Steristrips may fall off in 1-3 weeks.   07/01/22 5027            Follow-up Information     [facility]. Go on 07/14/2022.   Why: Please arrive 15 minutes prior to your pre-op class at 5:15pm Contact information: [address], [place], [place] [address]        Faisal Stradling, De Blanch, MD. Go on 07/29/2022.   Specialty: General Surgery Why: Please arrive 15 minutes prior to your appointment at 9:10am. Thank you. Contact information: [address] [place] [address] [phone]         Amori Colomb, De Blanch, MD. Go on 08/26/2022.   Specialty: General Surgery Why: Please arrive 15 minutes prior to your appointment at 9am. Thank you. Contact information: [address] [place] [address] [phone]                  The results of significant diagnostics from this hospitalization (including imaging, microbiology, ancillary and laboratory) are listed below for reference.     Significant Diagnostic Studies: DG Chest 2 View  Result Date: 06/23/2022 CLINICAL DATA:  Preoperative evaluation. EXAM: CHEST - 2 VIEW COMPARISON:  Chest radiograph 09/13/2018 FINDINGS: The heart size and mediastinal contours are within normal limits. Both lungs are clear. The visualized skeletal structures are unremarkable. IMPRESSION: No active cardiopulmonary disease. Electronically Signed   By: Annia Belt M.D.   On: 06/23/2022 12:56    Labs: Basic Metabolic Panel: Recent Labs  Lab 06/30/22 1907  CREATININE 0.90   Liver Function Tests: No results for input(s): "AST", "ALT", "ALKPHOS", "BILITOT", "PROT", "ALBUMIN" in the last 168 hours.  CBC: Recent Labs  Lab 06/30/22 1907 07/01/22 0509  WBC 14.8* 12.1*  NEUTROABS  --  10.5*  HGB 13.4 12.6  HCT 40.4 38.0  MCV 91.6 91.1  PLT 244 252    CBG: No results for input(s): "GLUCAP" in the last 168 hours.  Principal Problem:   Class II obesity   VTE plan: resume xarelto 7/22 ([email or web address])  Time coordinating discharge: 15 min

## 2022-07-03 ENCOUNTER — Telehealth (HOSPITAL_COMMUNITY): Payer: Self-pay | Admitting: *Deleted

## 2022-07-06 ENCOUNTER — Emergency Department (HOSPITAL_COMMUNITY): Payer: 59

## 2022-07-06 ENCOUNTER — Other Ambulatory Visit: Payer: Self-pay

## 2022-07-06 ENCOUNTER — Encounter (HOSPITAL_COMMUNITY): Payer: Self-pay

## 2022-07-06 ENCOUNTER — Telehealth (HOSPITAL_COMMUNITY): Payer: Self-pay | Admitting: *Deleted

## 2022-07-06 ENCOUNTER — Observation Stay (HOSPITAL_COMMUNITY)
Admission: EM | Admit: 2022-07-06 | Discharge: 2022-07-07 | Disposition: A | Discharge status: Home / Self Care | Payer: 59 | Attending: Internal Medicine | Admitting: Internal Medicine

## 2022-07-06 DIAGNOSIS — Z8679 Personal history of other diseases of the circulatory system: Secondary | ICD-10-CM | POA: Insufficient documentation

## 2022-07-06 DIAGNOSIS — Z79899 Other long term (current) drug therapy: Secondary | ICD-10-CM | POA: Insufficient documentation

## 2022-07-06 DIAGNOSIS — R778 Other specified abnormalities of plasma proteins: Secondary | ICD-10-CM | POA: Insufficient documentation

## 2022-07-06 DIAGNOSIS — R0789 Other chest pain: Secondary | ICD-10-CM | POA: Diagnosis present

## 2022-07-06 DIAGNOSIS — Z7901 Long term (current) use of anticoagulants: Secondary | ICD-10-CM | POA: Diagnosis not present

## 2022-07-06 DIAGNOSIS — E860 Dehydration: Secondary | ICD-10-CM | POA: Diagnosis present

## 2022-07-06 DIAGNOSIS — I480 Paroxysmal atrial fibrillation: Secondary | ICD-10-CM | POA: Diagnosis not present

## 2022-07-06 DIAGNOSIS — Z7982 Long term (current) use of aspirin: Secondary | ICD-10-CM | POA: Diagnosis not present

## 2022-07-06 DIAGNOSIS — I959 Hypotension, unspecified: Principal | ICD-10-CM | POA: Insufficient documentation

## 2022-07-06 DIAGNOSIS — I5032 Chronic diastolic (congestive) heart failure: Secondary | ICD-10-CM | POA: Diagnosis not present

## 2022-07-06 DIAGNOSIS — I110 Hypertensive heart disease with heart failure: Secondary | ICD-10-CM | POA: Diagnosis not present

## 2022-07-06 DIAGNOSIS — I4891 Unspecified atrial fibrillation: Secondary | ICD-10-CM | POA: Diagnosis present

## 2022-07-06 DIAGNOSIS — Z9884 Bariatric surgery status: Secondary | ICD-10-CM | POA: Insufficient documentation

## 2022-07-06 DIAGNOSIS — D72829 Elevated white blood cell count, unspecified: Secondary | ICD-10-CM | POA: Diagnosis not present

## 2022-07-06 LAB — URINALYSIS, ROUTINE W REFLEX MICROSCOPIC
Bilirubin Urine: NEGATIVE
Glucose, UA: NEGATIVE mg/dL
Hgb urine dipstick: NEGATIVE
Ketones, ur: 20 mg/dL — AB
Nitrite: NEGATIVE
Protein, ur: NEGATIVE mg/dL
Specific Gravity, Urine: 1.029 (ref 1.005–1.030)
pH: 5 (ref 5.0–8.0)

## 2022-07-06 LAB — D-DIMER, QUANTITATIVE: D-Dimer, Quant: 3.6 ug/mL-FEU — ABNORMAL HIGH (ref 0.00–0.50)

## 2022-07-06 LAB — DIFFERENTIAL
Abs Immature Granulocytes: 0.1 10*3/uL — ABNORMAL HIGH (ref 0.00–0.07)
Basophils Absolute: 0.1 10*3/uL (ref 0.0–0.1)
Basophils Relative: 1 %
Eosinophils Absolute: 0.2 10*3/uL (ref 0.0–0.5)
Eosinophils Relative: 1 %
Immature Granulocytes: 1 %
Lymphocytes Relative: 17 %
Lymphs Abs: 2.2 10*3/uL (ref 0.7–4.0)
Monocytes Absolute: 1 10*3/uL (ref 0.1–1.0)
Monocytes Relative: 8 %
Neutro Abs: 9.7 10*3/uL — ABNORMAL HIGH (ref 1.7–7.7)
Neutrophils Relative %: 72 %

## 2022-07-06 LAB — BASIC METABOLIC PANEL
Anion gap: 15 (ref 5–15)
BUN: 21 mg/dL — ABNORMAL HIGH (ref 6–20)
CO2: 24 mmol/L (ref 22–32)
Calcium: 9.3 mg/dL (ref 8.9–10.3)
Chloride: 98 mmol/L (ref 98–111)
Creatinine, Ser: 0.99 mg/dL (ref 0.44–1.00)
GFR, Estimated: >60 mL/min (ref >60)
Glucose, Bld: 94 mg/dL (ref 70–99)
Potassium: 3.8 mmol/L (ref 3.5–5.1)
Sodium: 137 mmol/L (ref 135–145)

## 2022-07-06 LAB — TROPONIN I (HIGH SENSITIVITY)
Troponin I (High Sensitivity): 30 ng/L — ABNORMAL HIGH (ref <18)
Troponin I (High Sensitivity): 31 ng/L — ABNORMAL HIGH (ref <18)

## 2022-07-06 LAB — CBC
HCT: 42.9 % (ref 36.0–46.0)
Hemoglobin: 14.2 g/dL (ref 12.0–15.0)
MCH: 30.1 pg (ref 26.0–34.0)
MCHC: 33.1 g/dL (ref 30.0–36.0)
MCV: 90.9 fL (ref 80.0–100.0)
Platelets: 320 10*3/uL (ref 150–400)
RBC: 4.72 MIL/uL (ref 3.87–5.11)
RDW: 12.3 % (ref 11.5–15.5)
WBC: 13.3 10*3/uL — ABNORMAL HIGH (ref 4.0–10.5)
nRBC: 0 % (ref 0.0–0.2)

## 2022-07-06 LAB — BRAIN NATRIURETIC PEPTIDE: B Natriuretic Peptide: 114.1 pg/mL — ABNORMAL HIGH (ref 0.0–100.0)

## 2022-07-06 LAB — HIV ANTIBODY (ROUTINE TESTING W REFLEX): HIV Screen 4th Generation wRfx: NONREACTIVE

## 2022-07-06 MED ORDER — LACTATED RINGERS IV SOLN: INTRAVENOUS | Status: DC

## 2022-07-06 MED ORDER — ROSUVASTATIN CALCIUM 5 MG PO TABS
5.0000 mg | ORAL_TABLET | Freq: Every day | ORAL | Status: DC
  Administered 2022-07-06: 5 mg via ORAL
  Filled 2022-07-06 (×2): qty 1

## 2022-07-06 MED ORDER — PANTOPRAZOLE SODIUM 40 MG PO TBEC
40.0000 mg | DELAYED_RELEASE_TABLET | Freq: Every day | ORAL | Status: DC
  Administered 2022-07-06: 40 mg via ORAL
  Filled 2022-07-06: qty 1

## 2022-07-06 MED ORDER — DILTIAZEM HCL 25 MG/5ML IV SOLN: 10.0000 mg | Freq: Four times a day (QID) | INTRAVENOUS | Status: DC | PRN

## 2022-07-06 MED ORDER — ACETAMINOPHEN 325 MG PO TABS: 650.0000 mg | ORAL_TABLET | Freq: Four times a day (QID) | ORAL | Status: DC | PRN

## 2022-07-06 MED ORDER — RISAQUAD PO CAPS
1.0000 | ORAL_CAPSULE | Freq: Every day | ORAL | Status: DC
  Administered 2022-07-07: 1 via ORAL
  Filled 2022-07-06: qty 1

## 2022-07-06 MED ORDER — ASPIRIN 325 MG PO TABS
325.0000 mg | ORAL_TABLET | Freq: Every day | ORAL | Status: DC
  Administered 2022-07-06 – 2022-07-07 (×2): 325 mg via ORAL
  Filled 2022-07-06 (×2): qty 1

## 2022-07-06 MED ORDER — HYPROMELLOSE (GONIOSCOPIC) 2.5 % OP SOLN
1.0000 [drp] | OPHTHALMIC | Status: DC | PRN
Start: 2022-07-06 — End: 2022-07-06

## 2022-07-06 MED ORDER — SODIUM CHLORIDE 0.9 % IV BOLUS
500.0000 mL | Freq: Once | INTRAVENOUS | Status: AC
  Administered 2022-07-06: 500 mL via INTRAVENOUS

## 2022-07-06 MED ORDER — POLYVINYL ALCOHOL 1.4 % OP SOLN: 1.0000 [drp] | OPHTHALMIC | Status: DC | PRN

## 2022-07-06 MED ORDER — SODIUM CHLORIDE 0.9 % IV BOLUS
1000.0000 mL | Freq: Once | INTRAVENOUS | Status: AC
  Administered 2022-07-06: 1000 mL via INTRAVENOUS

## 2022-07-06 MED ORDER — ACETAMINOPHEN 325 MG PO TABS
650.0000 mg | ORAL_TABLET | ORAL | Status: DC | PRN
Start: 2022-07-06 — End: 2022-07-07

## 2022-07-06 MED ORDER — RIVAROXABAN 10 MG PO TABS
20.0000 mg | ORAL_TABLET | Freq: Every day | ORAL | Status: DC
  Administered 2022-07-06: 20 mg via ORAL
  Filled 2022-07-06: qty 1
  Filled 2022-07-06: qty 2

## 2022-07-06 MED ORDER — IOHEXOL 350 MG/ML SOLN
100.0000 mL | Freq: Once | INTRAVENOUS | Status: AC | PRN
  Administered 2022-07-06: 70 mL via INTRAVENOUS

## 2022-07-06 MED ORDER — ONDANSETRON HCL 4 MG/2ML IJ SOLN: 4.0000 mg | Freq: Four times a day (QID) | INTRAMUSCULAR | Status: DC | PRN

## 2022-07-06 NOTE — ED Notes (Signed)
Pt unable to tolerate orthostatic vital signs. Pt stated she felt a little dizzy when sitting up. While BP was cycling while pt standing, pt became weak, lightheaded and sank down. This RN and pts daughter was able to help pt sit sit on side of the bed. Pt now resting in bed with call light within reach. BP at rest 98/55.

## 2022-07-06 NOTE — Telephone Encounter (Signed)
1.  Tell me about your pain and pain management? Pt denies any current pain.   2.  Let's talk about fluid intake.  How much total fluid are you taking in? Pt states that she is working to meet goal of 64 oz of fluid today.  Pt has been able to consume approx. 35 oz of fluid per day since surgery.  Pt plans to increase clear liquids to meet fluid goals. Pt states that s/he is working to meet goal of 64 oz of fluid today.  Pt encouraged to continue to work towards meeting goal.  Pt instructed to assess status and suggestions daily utilizing Hydration Action Plan on discharge folder and to call [facility] if in the "red zone".   3.  How much protein have you taken in the last 2 days? Pt states she is meeting her goal of 60g of protein each day with the protein shakes.  Pt states they are "hard to get down and feels like heartburn". Instructed pt to also try protein powder to add to fluids and see if that makes a difference. If not, instructed pt to call [facility].  4.  Have you had nausea?  Tell me about when have experienced nausea and what you did to help? Pt denies nausea.   5.  Has the frequency or color changed with your urine? Pt states that she is urinating "fine" with no changes in frequency or urgency.     6.  Tell me what your incisions look like? "Incisions look fine". Pt denies a fever, chills.  Pt states incisions are not swollen, open, or draining.  Pt encouraged to call [facility] if incisions change.   7.  Have you been passing gas? BM? Pt states that she is having BMs. Last BM 07/03/22.    8.  If a problem or question were to arise who would you call?  Do you know contact numbers for [facility], [facility], and [facility]? Pt denies dehydration symptoms.  Pt can describe s/sx of dehydration.  Pt knows to call [facility] for surgical, [facility] for nutrition, and [facility] for non-urgent questions or concerns.   9.  How has the walking going? Pt states she is walking around and able to be active without difficulty.   10. Are you still  using your incentive spirometer?  If so, how often? Pt states that she is doing the I.S. 10x q1h.  Pt encouraged to continue to use incentive spirometer, at least 10x every hour while awake until she sees the surgeon.  11.  How are your vitamins and calcium going?  How are you taking them? Pt states that she will begin the supplements on Monday. Reinforced education about taking supplements at least two hours apart.  VTE: Reinforced instructions for patient to resume Xarelto tomorrow 7/22.  Pt denies any SOB, pain or swelling in legs.   Reminded patient that the first 30 days post-operatively are important for successful recovery.  Practice good hand hygiene, wearing a mask when appropriate (since optional in most places), and minimizing exposure to people who live outside of the home, especially if they are exhibiting any respiratory, GI, or illness-like symptoms.

## 2022-07-06 NOTE — ED Provider Triage Note (Signed)
Emergency Medicine Provider Triage Evaluation Note  Audrey Guerra , a 56 y.o. female  was evaluated in triage.  Pt complains of near syncope.  Patient has a previous history of A-fib on Xarelto.  She recently had bariatric surgery and came off of her Xarelto during that time.  Patient states she only remembers getting 1 shot of Lovenox.  Today she had onset of feeling like she was going to pass out and feeling short of breath.  She has been taking her Xarelto since this past Saturday.  She denies racing or skipping in her heart and black or tarry stools.  Review of Systems  Positive: Lightheadedness Negative: Loss of consciousness  Physical Exam  BP 91/65 (BP Location: Left Arm)   Pulse 75   Temp 98.4 F (36.9 C) (Oral)   Resp 18   Ht 5\' 5"  (1.651 m)   Wt 103 kg   SpO2 96%   BMI 37.79 kg/m  Gen:   Awake, no distress   Resp:  Normal effort  MSK:   Moves extremities without difficulty  Other:  Appears weak  Medical Decision Making  Medically screening exam initiated at 12:18 PM.  Appropriate orders placed.  BLAYKE PINERA was informed that the remainder of the evaluation will be completed by another provider, this initial triage assessment does not replace that evaluation, and the importance of remaining in the ED until their evaluation is complete.  Work-up initiated.  Patient also complaining of left leg numbness on the outside.  Not consistent with stroke.   Liana Crocker, PA-C 07/06/22 1220

## 2022-07-06 NOTE — H&P (Addendum)
TRH H&P   Patient Demographics:    Audrey Guerra, is a 56 y.o. female  MRN: [id number]   DOB - 28-Jan-1966  Admit Date - 07/06/2022  Outpatient Primary MD for the patient is     Patient coming from: [facility] ER  No chief complaint on file.     HPI:    Audrey Guerra  is a 56 y.o. female, with past medical history of atrial fibrillation on beta-blocker and Xarelto, morbid obesity s/p gastric sleeve surgery few days ago, morbid obesity, hypertension, dyslipidemia who presents to the hospital with chief complaints of fatigue and some exertional shortness of breath.  Patient with above history who had a gastric sleeve procedure about a week ago at [facility] after which she has not been eating or drinking well for several days, presents when she started experiencing gradually progressive fatigue, lightheadedness upon landing up and some exertional shortness of breath for the last 2 to 3 days, symptoms much worse today, presented to the ER where her work-up was consistent with severe dehydration, hypotension, nonspecific chest discomfort and I was called to admit the patient for dehydration with orthostatic hypotension.  CTA, EKG nonacute, troponin trend flat and in non-ACS pattern.  She did stop her Xarelto around her surgery but has been resumed 2 days ago.  Patient denies any fever chills, no headache, currently no chest pain or palpitations, at rest no shortness of breath, no cough, no diarrhea or dysuria, no blood in stool or urine.  No focal weakness.    Review of systems:    A full 10 point Review of Systems was done, except as stated above, all other Review of Systems were negative.   With Past History  of the following :    Past Medical History:  Diagnosis Date   Atrial fibrillation ([place])    BMI 40.0-44.9, adult ([place])    Cardiomegaly    Hyperlipidemia    Hypertension    Morbid obesity ([place])    Osteoarthritis    PTSD (post-traumatic stress disorder)       Past Surgical History:  Procedure Laterality Date   CESAREAN SECTION     2 previous c-sections   LAPAROSCOPIC GASTRIC SLEEVE RESECTION N/A 06/30/2022   Procedure: LAPAROSCOPIC SLEEVE GASTRECTOMY;  Surgeon: Mickeal Skinner, MD;  Location: [facility] ORS;  Service: General;  Laterality: N/A;   UPPER GI ENDOSCOPY N/A 06/30/2022   Procedure: UPPER GI ENDOSCOPY;  Surgeon: Kieth Brightly, Arta Bruce, MD;  Location: [facility] OR[facility];  Service: General;  Laterality: N/A;      Social History:     Social History   Tobacco Use   Smoking status: Former   Smokeless tobacco: Never  Substance Use Topics   Alcohol use: Not Currently    Comment: social         Family History :     Family History  Problem Relation Age of Onset   Diabetes Mother    Diabetes Father        Home Medications:   Prior to Admission medications   Medication Sig Start Date End Date Taking? Authorizing Provider  acetaminophen (TYLENOL) 500 MG tablet Take 2 tablets (1,000 mg total) by mouth every 8 (eight) hours for 5 days. Patient taking differently: Take 1,000 mg by mouth every 6 (six) hours as needed for moderate pain. 07/01/22 07/06/22 Yes Kinsinger, Arta Bruce, MD  acidophilus (RISAQUAD) CAPS capsule Take 1 capsule by mouth daily.   Yes [provider]  hydrochlorothiazide (HYDRODIURIL) 12.5 MG tablet Take 12.5 mg by mouth every morning. 04/22/22  Yes [provider]  hydroxypropyl methylcellulose / hypromellose (ISOPTO TEARS / GONIOVISC) 2.5 % ophthalmic solution Place 1 drop into both eyes as needed for dry eyes.   Yes [provider]  Menthol, Topical Analgesic, (ICY HOT) 7.5 % (Roll) MISC Apply 1 each topically daily as needed (pain).    Yes [provider]  metoprolol tartrate (LOPRESSOR) 25 MG tablet Take 25 mg by mouth in the morning and at bedtime. 06/27/20  Yes [provider]  ondansetron (ZOFRAN-ODT) 4 MG disintegrating tablet Dissolve 1 tablet (4 mg total) by mouth every 6 (six) hours as needed for nausea or vomiting. 07/01/22  Yes Kinsinger, Arta Bruce, MD  pantoprazole (PROTONIX) 40 MG tablet Take 1 tablet (40 mg total) by mouth daily. 07/01/22  Yes Kinsinger, Arta Bruce, MD  rivaroxaban (XARELTO) 20 MG TABS tablet Take 1 tablet (20 mg total) by mouth daily. 07/04/22 07/04/23 Yes Kinsinger, Arta Bruce, MD  rosuvastatin (CRESTOR) 5 MG tablet Take 5 mg by mouth daily.   Yes [provider]  valsartan (DIOVAN) 80 MG tablet Take 80 mg by mouth daily. 04/22/22  Yes [provider]     Allergies:    No Known Allergies   Physical Exam:   Vitals  Blood pressure 113/74, pulse (!) 50, temperature 98.4 F (36.9 C), temperature source Oral, resp. rate 17, height 5\' 5"  (1.651 m), weight 103 kg, SpO2 100 %.   1. General -obese middle-aged African-American female lying in hospital bed in no apparent distress.  2. Normal affect and insight, Not Suicidal or Homicidal, Awake Alert,   3. No F.N deficits, ALL C.Nerves Intact, Strength 5/5 all 4 extremities, Sensation intact all 4 extremities, Plantars down going.  4. Ears and Eyes appear Normal, Conjunctivae clear, PERRLA. Moist Oral Mucosa.  5. Supple Neck, No JVD, No cervical lymphadenopathy appriciated, No Carotid Bruits.  6. Symmetrical Chest wall movement, Good air movement bilaterally, CTAB.  7. RRR, No Gallops, Rubs or Murmurs, No Parasternal Heave.  8. Positive Bowel Sounds, Abdomen Soft, No tenderness, No organomegaly appriciated,No rebound -guarding or rigidity.  9.  No Cyanosis, Normal Skin Turgor, No Skin Rash or Bruise.  10. Good muscle tone,  joints appear normal , no effusions, Normal ROM.  11. No Palpable Lymph Nodes in  Neck or Axillae      Data Review:   Recent Labs  Lab 06/30/22 1907 07/01/22 0509 07/06/22 1240  WBC 14.8* 12.1* 13.3*  HGB 13.4 12.6 14.2  HCT 40.4 38.0 42.9  PLT 244 252 320  MCV 91.6 91.1 90.9  MCH 30.4 30.2 30.1  MCHC 33.2 33.2 33.1  RDW 12.5 12.4 12.3  LYMPHSABS  --  0.9 2.2  MONOABS  --  0.6 1.0  EOSABS  --  0.0 0.2  BASOSABS  --  0.0 0.1    Recent Labs  Lab 06/30/22 1907 07/06/22 1240 07/06/22 1409  NA  --   --  137  K  --   --  3.8  CL  --   --  98  CO2  --   --  24  GLUCOSE  --   --  94  BUN  --   --  21*  CREATININE 0.90  --  0.99  CALCIUM  --   --  9.3  DDIMER  --  3.60*  --   BNP  --  114.1*  --     Urinalysis No results found for: "COLORURINE", "APPEARANCEUR", "LABSPEC", "PHURINE", "GLUCOSEU", "HGBUR", "BILIRUBINUR", "KETONESUR", "PROTEINUR", "UROBILINOGEN", "NITRITE", "LEUKOCYTESUR"    Imaging Results:    CT Angio Chest PE W/Cm &/Or Wo Cm  Result Date: 07/06/2022 CLINICAL DATA:  Pulmonary embolism (PE) suspected, high prob EXAM: CT ANGIOGRAPHY CHEST WITH CONTRAST TECHNIQUE: Multidetector CT imaging of the chest was performed using the standard protocol during bolus administration of intravenous contrast. Multiplanar CT image reconstructions and MIPs were obtained to evaluate the vascular anatomy. RADIATION DOSE REDUCTION: This exam was performed according to the departmental dose-optimization program which includes automated exposure control, adjustment of the mA and/or kV according to patient size and/or use of iterative reconstruction technique. CONTRAST:  33mL OMNIPAQUE IOHEXOL 350 MG/ML SOLN COMPARISON:  None Available. FINDINGS: Cardiovascular: Satisfactory opacification of the pulmonary arteries to the segmental level. No evidence of pulmonary embolism. Normal cardiac size.No pericardial disease. Mild atherosclerosis of the thoracic aorta. Mediastinum/Nodes: No lymphadenopathy. The thyroid is unremarkable. Esophagus is unremarkable. Lungs/Pleura:  Bibasilar subsegmental atelectasis, left greater than right. No other airspace disease. Mild bronchial wall thickening. No pneumothorax. No pleural effusion. Mild centrilobular emphysema. Upper Abdomen: No acute abnormality. Colonic diverticula noted. Prior gastric surgery with mild adjacent left upper quadrant stranding, likely postoperative. Musculoskeletal: No chest wall abnormality. No acute or significant osseous findings. Review of the MIP images confirms the above findings. IMPRESSION: No evidence of pulmonary embolism. Bibasilar subsegmental atelectasis, left greater than right. Electronically Signed   By: Caprice Renshaw M.D.   On: 07/06/2022 16:12   DG Chest 2 View  Result Date: 07/06/2022 CLINICAL DATA:  Shortness of breath EXAM: CHEST - 2 VIEW COMPARISON:  None Available. FINDINGS: Cardiac and mediastinal contours are within normal limits. No focal pulmonary opacity. No pleural effusion or pneumothorax. No acute osseous abnormality. IMPRESSION: No acute cardiopulmonary process. Electronically Signed   By: Wiliam Ke M.D.   On: 07/06/2022 12:34    My personal review of EKG: Rhythm NSR, no acute ST changes.   Assessment & Plan:   Severe dehydration with hypotension in a patient who recently had gastric sleeve surgery 1 week ago, not eating or drinking well since her surgery and still taking beta-blocker and moderate doses of ARB .  This appears to be a case of severe dehydration worsened by patient being on 2 blood pressure medications, her CTA chest, EKG and troponin  trend are all unremarkable.  She will be hydrated with IV fluids, blood pressure medications will be held, advance activity.  PT and monitor.  2.  Exertional shortness of breath.  Likely due to hypotension and hypoperfusion, CTA chest negative, EKG negative, troponin trend is flat and in non-ACS pattern, she has no chest pain, will check echocardiogram 1 time to evaluate wall motion and EF.  Otherwise supportive care with IV  fluids, I-S to avoid atelectasis, as needed oxygen.  3.  Morbid obesity s/p gastric sleeve procedure 1 week ago.  Supportive care  4.  Paroxysmal atrial fibrillation [place] vas 2 score of greater than 3.  For now blood pressure is too low hence beta-blocker will be held, continue Xarelto which was commenced 2 days ago i.e. 4 days after her surgery.  5.  Leukocytosis.  Appears to be present since the day of her surgery, she is afebrile, CTA chest negative, will check UA and monitor currently does not appear toxic.  6.  Dyslipidemia.  Continue home dose statin.  7.  GERD.  On PPI.  8.  Elevated D-dimer.  Likely due to recent surgery, CTA chest negative, check lower extremity venous duplex.  Already on Xarelto.   DVT Prophylaxis Xaralto  AM Labs Ordered, also please review Full Orders  Family Communication: Admission, patients condition and plan of care including tests being ordered have been discussed with the patient  who indicates understanding and agree with the plan and Code Status.  Code Status Full  Likely DC to  Home  Condition Fair  Consults called: Cards    Admission status: Obs    Time spent in minutes : 35   Susa Raring M.D on 07/06/2022 at 5:32 PM  To page go to [email or web address] - password [email or web address]

## 2022-07-06 NOTE — ED Notes (Signed)
Patient transported to CT 

## 2022-07-06 NOTE — Progress Notes (Signed)
ANTICOAGULATION CONSULT NOTE - Initial Consult  Pharmacy Consult for Xarelto Indication: atrial fibrillation  No Known Allergies  Patient Measurements: Height: 5\' 5"  (165.1 cm) Weight: 103 kg (227 lb 1.2 oz) IBW/kg (Calculated) : 57  Vital Signs: Temp: 98.4 F (36.9 C) (07/24 1632) Temp Source: Oral (07/24 1632) BP: 113/74 (07/24 1630) Pulse Rate: 50 (07/24 1630)  Labs: Recent Labs    07/06/22 1240 07/06/22 1355 07/06/22 1409  HGB 14.2  --   --   HCT 42.9  --   --   PLT 320  --   --   CREATININE  --   --  0.99  TROPONINIHS 30* 31*  --     Estimated Creatinine Clearance: 75.5 mL/min (by C-G formula based on SCr of 0.99 mg/dL).   Medical History: Past Medical History:  Diagnosis Date   Atrial fibrillation ([facility])    BMI 40.0-44.9, adult ([facility])    Cardiomegaly    Hyperlipidemia    Hypertension    Morbid obesity ([facility])    Osteoarthritis    PTSD (post-traumatic stress disorder)     Medications:  (Not in a hospital admission)  Scheduled:   [START ON 07/07/2022] acidophilus  1 capsule Oral Daily   aspirin  325 mg Oral Daily   pantoprazole  40 mg Oral Daily   rivaroxaban  20 mg Oral Q supper   rosuvastatin  5 mg Oral Daily   Infusions:   lactated ringers     sodium chloride      Assessment: 56 yof with a history of AF, morbid obesity s/p gastric sleeve surgery recently, morbid obesity, HTN, dyslipidemia. Patient presenting with fatigue and SOB. Pharmacy consulted for Xarelto continuation from home therapy.  Patient last taken Xarelto 7/23 at 2200 per med rec.  Hgb 14.2; plt 320 Estimated Creatinine Clearance: 75.5 mL/min (by C-G formula based on SCr of 0.99 mg/dL).  Goal of Therapy:  Monitor platelets by anticoagulation protocol: Yes   Plan:  Continue Xarelto 20mg  daily Monitor for s/s of hemorrhage, renal function, and for interacting medications  8/23, PharmD, BCPS 07/06/2022 5:42 PM ED Clinical Pharmacist -  [phone]

## 2022-07-06 NOTE — ED Provider Notes (Signed)
[facility] EMERGENCY DEPARTMENT Provider Note   CSN: [id number] Arrival date & time: 07/06/22  1110     History  No chief complaint on file.   Audrey Guerra is a 56 y.o. female.  HPI  Patient with medical history of A-fib on Xarelto, hypertension, hyperlipidemia, cardiomegaly, morbid obesity status post gastric sleeve resection 06/30/2022 presents today due to chest pain and shortness of breath.  Symptoms started this morning at 6 AM, they have been constant.  The shortness of breath is worsened by exertion.  The chest pain comes and goes, worse with inspiration.  It is left-sided, does not radiate.  She endorses feeling dizzy like she may pass out, no syncopal episode.  No cough or fever, no hemoptysis, no upper extremity weakness or numbness.  Patient restarted her Xarelto 2 days ago.  Patient also endorses numbness/tingling sensation to the left lower extremity.  It is on the lateral side going from her mid back to knee.  She has sensation but it feels decreased, no recent injury.  Does not go past the knee, good ROM.  It is constant, started 2 days ago.  Mild abdominal pain on the excision site from the gastric sleeve, no purulent discharge.  No nausea or vomiting, no change in bowel habits.  Home Medications Prior to Admission medications   Medication Sig Start Date End Date Taking? Authorizing Provider  acetaminophen (TYLENOL) 500 MG tablet Take 2 tablets (1,000 mg total) by mouth every 8 (eight) hours for 5 days. 07/01/22 07/06/22  Kinsinger, De Blanch, MD  acidophilus (RISAQUAD) CAPS capsule Take 1 capsule by mouth daily.    [provider]  hydrochlorothiazide (HYDRODIURIL) 12.5 MG tablet Take 12.5 mg by mouth every morning. 04/22/22   [provider]  hydroxypropyl methylcellulose / hypromellose (ISOPTO TEARS / GONIOVISC) 2.5 % ophthalmic solution Place 1 drop into both eyes as needed for dry eyes.    [provider]  Menthol,  Topical Analgesic, (ICY HOT) 7.5 % (Roll) MISC Apply 1 each topically daily as needed (pain).    [provider]  metoprolol tartrate (LOPRESSOR) 25 MG tablet Take 25 mg by mouth in the morning and at bedtime. 06/27/20   [provider]  ondansetron (ZOFRAN-ODT) 4 MG disintegrating tablet Dissolve 1 tablet (4 mg total) by mouth every 6 (six) hours as needed for nausea or vomiting. 07/01/22   Kinsinger, De Blanch, MD  pantoprazole (PROTONIX) 40 MG tablet Take 1 tablet (40 mg total) by mouth daily. 07/01/22   Kinsinger, De Blanch, MD  rivaroxaban (XARELTO) 20 MG TABS tablet Take 1 tablet (20 mg total) by mouth daily. 07/04/22 07/04/23  Kinsinger, De Blanch, MD  rosuvastatin (CRESTOR) 5 MG tablet Take 5 mg by mouth daily.    [provider]  valsartan (DIOVAN) 80 MG tablet Take 80 mg by mouth daily. 04/22/22   [provider]      Allergies    Patient has no known allergies.    Review of Systems   Review of Systems  Physical Exam Updated Vital Signs BP 113/74   Pulse (!) 50   Temp 98.4 F (36.9 C) (Oral)   Resp 17   Ht 5\' 5"  (1.651 m)   Wt 103 kg   SpO2 100%   BMI 37.79 kg/m  Physical Exam Vitals and nursing note reviewed. Exam conducted with a chaperone present.  Constitutional:      Appearance: Normal appearance.  HENT:     Head: Normocephalic and  atraumatic.  Eyes:     General: No scleral icterus.       Right eye: No discharge.        Left eye: No discharge.     Extraocular Movements: Extraocular movements intact.     Pupils: Pupils are equal, round, and reactive to light.  Cardiovascular:     Rate and Rhythm: Normal rate. Rhythm irregular.     Pulses: Normal pulses.     Heart sounds: Normal heart sounds. No murmur heard.    No friction rub. No gallop.  Pulmonary:     Effort: Pulmonary effort is normal. No respiratory distress.     Breath sounds: Normal breath sounds.  Abdominal:     General: Abdomen is flat. Bowel sounds are normal.  There is no distension.     Palpations: Abdomen is soft.     Tenderness: There is abdominal tenderness.     Comments: Mild tenderness superior to laparoscopic scar.  No surrounding erythema, no purulence.  Steri-Strips in place.  Musculoskeletal:     Comments: No calf tenderness.  Moving upper and lower extremities without difficulty.  Tingling sensation laterally from left hip to knee.  No pain, no overlying skin discoloration.  Skin:    General: Skin is warm and dry.     Coloration: Skin is not jaundiced.  Neurological:     Mental Status: She is alert. Mental status is at baseline.     Coordination: Coordination normal.     ED Results / Procedures / Treatments   Labs (all labs ordered are listed, but only abnormal results are displayed) Labs Reviewed  CBC - Abnormal; Notable for the following components:      Result Value   WBC 13.3 (*)    All other components within normal limits  BRAIN NATRIURETIC PEPTIDE - Abnormal; Notable for the following components:   B Natriuretic Peptide 114.1 (*)    All other components within normal limits  DIFFERENTIAL - Abnormal; Notable for the following components:   Neutro Abs 9.7 (*)    Abs Immature Granulocytes 0.10 (*)    All other components within normal limits  D-DIMER, QUANTITATIVE - Abnormal; Notable for the following components:   D-Dimer, Quant 3.60 (*)    All other components within normal limits  BASIC METABOLIC PANEL - Abnormal; Notable for the following components:   BUN 21 (*)    All other components within normal limits  TROPONIN I (HIGH SENSITIVITY) - Abnormal; Notable for the following components:   Troponin I (High Sensitivity) 30 (*)    All other components within normal limits  TROPONIN I (HIGH SENSITIVITY) - Abnormal; Notable for the following components:   Troponin I (High Sensitivity) 31 (*)    All other components within normal limits    EKG EKG Interpretation  Date/Time:  Monday July 06 2022 12:09:41  EDT Ventricular Rate:  85 PR Interval:  144 QRS Duration: 72 QT Interval:  356 QTC Calculation: 423 R Axis:   32 Text Interpretation: Sinus rhythm with Premature atrial complexes Nonspecific T wave abnormality Abnormal ECG No previous ECGs available Confirmed by Linwood Dibbles [id number]) on 07/06/2022 2:47:15 PM  Radiology CT Angio Chest PE W/Cm &/Or Wo Cm  Result Date: 07/06/2022 CLINICAL DATA:  Pulmonary embolism (PE) suspected, high prob EXAM: CT ANGIOGRAPHY CHEST WITH CONTRAST TECHNIQUE: Multidetector CT imaging of the chest was performed using the standard protocol during bolus administration of intravenous contrast. Multiplanar CT image reconstructions and MIPs were obtained to evaluate the vascular  anatomy. RADIATION DOSE REDUCTION: This exam was performed according to the departmental dose-optimization program which includes automated exposure control, adjustment of the mA and/or kV according to patient size and/or use of iterative reconstruction technique. CONTRAST:  13mL OMNIPAQUE IOHEXOL 350 MG/ML SOLN COMPARISON:  None Available. FINDINGS: Cardiovascular: Satisfactory opacification of the pulmonary arteries to the segmental level. No evidence of pulmonary embolism. Normal cardiac size.No pericardial disease. Mild atherosclerosis of the thoracic aorta. Mediastinum/Nodes: No lymphadenopathy. The thyroid is unremarkable. Esophagus is unremarkable. Lungs/Pleura: Bibasilar subsegmental atelectasis, left greater than right. No other airspace disease. Mild bronchial wall thickening. No pneumothorax. No pleural effusion. Mild centrilobular emphysema. Upper Abdomen: No acute abnormality. Colonic diverticula noted. Prior gastric surgery with mild adjacent left upper quadrant stranding, likely postoperative. Musculoskeletal: No chest wall abnormality. No acute or significant osseous findings. Review of the MIP images confirms the above findings. IMPRESSION: No evidence of pulmonary embolism. Bibasilar  subsegmental atelectasis, left greater than right. Electronically Signed   By: Maurine Simmering M.D.   On: 07/06/2022 16:12   DG Chest 2 View  Result Date: 07/06/2022 CLINICAL DATA:  Shortness of breath EXAM: CHEST - 2 VIEW COMPARISON:  None Available. FINDINGS: Cardiac and mediastinal contours are within normal limits. No focal pulmonary opacity. No pleural effusion or pneumothorax. No acute osseous abnormality. IMPRESSION: No acute cardiopulmonary process. Electronically Signed   By: Merilyn Baba M.D.   On: 07/06/2022 12:34    Procedures Procedures    Medications Ordered in ED Medications  aspirin tablet 325 mg (325 mg Oral Given 07/06/22 1531)  lactated ringers infusion (has no administration in time range)  pantoprazole (PROTONIX) EC tablet 40 mg (has no administration in time range)  rosuvastatin (CRESTOR) tablet 5 mg (has no administration in time range)  hydroxypropyl methylcellulose / hypromellose (ISOPTO TEARS / GONIOVISC) 2.5 % ophthalmic solution 1 drop (has no administration in time range)  acidophilus (RISAQUAD) capsule 1 capsule (has no administration in time range)  acetaminophen (TYLENOL) tablet 650 mg (has no administration in time range)  ondansetron (ZOFRAN) injection 4 mg (has no administration in time range)  sodium chloride 0.9 % bolus 1,000 mL (0 mLs Intravenous Stopped 07/06/22 1527)  sodium chloride 0.9 % bolus 1,000 mL (1,000 mLs Intravenous New Bag/Given 07/06/22 1528)  iohexol (OMNIPAQUE) 350 MG/ML injection 100 mL (70 mLs Intravenous Contrast Given 07/06/22 1602)    ED Course/ Medical Decision Making/ A&P                           Medical Decision Making Amount and/or Complexity of Data Reviewed Labs: ordered. Radiology: ordered.  Risk OTC drugs. Prescription drug management. Decision regarding hospitalization.   Patient presents due to shortness of breath, lightheadedness, chest pain.  Differential includes not limited to hypotension, ACS, PE, pneumonia,  pneumothorax, postoperative complication, electrolyte derangement, hypoglycemia.  Patient is neurovascularly intact, lungs are clear to auscultation bilaterally.  Heart rate is irregular but no murmurs auscultated.  Good ROM to extremities bilaterally, no calf tenderness.    Patient is on cardiac monitoring.  She is in sinus rhythm in the room at 60 bpm with frequent PACs.  Not currently in A-fib.  Patient is hypotensive in room.  She became very orthostatic when going from laying down to sitting up with a drop systolically from [id number] systolically to 87.  Unable to check from standing because patient became presyncopal.  I suspect the orthostatic hypotension is due to decreased oral intake postsurgically.  We  will provide 2 L fluid resuscitation.  I ordered and reviewed laboratory work-up.  Per my interpretation patient has a leukocytosis of 13.3 with left shift.  Dimer was elevated at 3.6, BNP elevated at 114 and initial troponin elevated at 30. Second 31, flat.  BMP is without gross electrolyte derangement or AKI.  I ordered and viewed the EKG which shows sinus rhythm with frequent PACs.  No specific ischemic findings, nonspecific T wave inversions without previous EKG to compare to.  I ordered and reviewed chest x-ray which is negative for any acute process.  I ordered and reviewed CTA PE study to evaluate for possible PE.  I reviewed those images and agree with radiologist interpretation, no acute PE.  Slight atelectasis.  I reevaluated the patient and she is feeling improved.  Her blood pressure is still soft, rate is regular.    I think the lightheadedness is orthostatic hypotension likely due to volume dilution due to decreased oral intake.  I have given her 2 L normal saline which has helped improve the lightheadedness.  I will consult cardiology given elevated troponins in a patient with chest pain to see if they have any recommendations.   -I spoke with Trish with cardiology, she advises  trending troponins and cardiology will follow.   I consulted with hospitalist Dr. Candiss Norse  They will admit patient.   I will consult hospitalist for admission. -Chest pain and elevated troponin -symptomatic hypotension         Final Clinical Impression(s) / ED Diagnoses Final diagnoses:  Elevated troponin  Symptomatic hypotension    Rx / DC Orders ED Discharge Orders     None         Sherrill Raring, PA-C 07/06/22 1656    Dorie Rank, MD 07/07/22 [phone]

## 2022-07-06 NOTE — ED Triage Notes (Signed)
Pt here pov w/ numbness, tingling in left leg from hip to knee starting 2 days. Patient w/ recent weight loss surgery last week. Pt endorses SHOB, chest pain, dizziness w/ exertion since 0600 this am. Patient is on xarelto stopped for procedure but restarted on Saturday.

## 2022-07-06 NOTE — Telephone Encounter (Signed)
Pt reached out with concerns of left thigh numbness and feeling dizzy this am after going to the bathroom.  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  Will f/u with patient.

## 2022-07-06 NOTE — ED Notes (Signed)
Patient ambulated to restroom. Tolerated well, denies any lightheadedness or dizziness.

## 2022-07-07 ENCOUNTER — Observation Stay (HOSPITAL_BASED_OUTPATIENT_CLINIC_OR_DEPARTMENT_OTHER): Payer: 59

## 2022-07-07 DIAGNOSIS — I5031 Acute diastolic (congestive) heart failure: Secondary | ICD-10-CM | POA: Diagnosis not present

## 2022-07-07 DIAGNOSIS — R7989 Other specified abnormal findings of blood chemistry: Secondary | ICD-10-CM | POA: Diagnosis not present

## 2022-07-07 DIAGNOSIS — I959 Hypotension, unspecified: Secondary | ICD-10-CM | POA: Diagnosis not present

## 2022-07-07 DIAGNOSIS — E860 Dehydration: Secondary | ICD-10-CM | POA: Diagnosis not present

## 2022-07-07 DIAGNOSIS — M7989 Other specified soft tissue disorders: Secondary | ICD-10-CM

## 2022-07-07 LAB — CBC WITH DIFFERENTIAL/PLATELET
Abs Immature Granulocytes: 0.04 10*3/uL (ref 0.00–0.07)
Basophils Absolute: 0 10*3/uL (ref 0.0–0.1)
Basophils Relative: 0 %
Eosinophils Absolute: 0.3 10*3/uL (ref 0.0–0.5)
Eosinophils Relative: 3 %
HCT: 35.1 % — ABNORMAL LOW (ref 36.0–46.0)
Hemoglobin: 12.4 g/dL (ref 12.0–15.0)
Immature Granulocytes: 0 %
Lymphocytes Relative: 26 %
Lymphs Abs: 2.3 10*3/uL (ref 0.7–4.0)
MCH: 31.4 pg (ref 26.0–34.0)
MCHC: 35.3 g/dL (ref 30.0–36.0)
MCV: 88.9 fL (ref 80.0–100.0)
Monocytes Absolute: 0.7 10*3/uL (ref 0.1–1.0)
Monocytes Relative: 8 %
Neutro Abs: 5.6 10*3/uL (ref 1.7–7.7)
Neutrophils Relative %: 63 %
Platelets: 243 10*3/uL (ref 150–400)
RBC: 3.95 MIL/uL (ref 3.87–5.11)
RDW: 12.5 % (ref 11.5–15.5)
WBC: 9.1 10*3/uL (ref 4.0–10.5)
nRBC: 0 % (ref 0.0–0.2)

## 2022-07-07 LAB — ECHOCARDIOGRAM COMPLETE
Calc EF: 57.4 %
Height: 65 in
S' Lateral: 3.2 cm
Single Plane A2C EF: 60.9 %
Single Plane A4C EF: 53.2 %
Weight: 3633.18 oz

## 2022-07-07 LAB — BASIC METABOLIC PANEL
Anion gap: 10 (ref 5–15)
BUN: 12 mg/dL (ref 6–20)
CO2: 23 mmol/L (ref 22–32)
Calcium: 8.6 mg/dL — ABNORMAL LOW (ref 8.9–10.3)
Chloride: 106 mmol/L (ref 98–111)
Creatinine, Ser: 0.84 mg/dL (ref 0.44–1.00)
GFR, Estimated: >60 mL/min (ref >60)
Glucose, Bld: 84 mg/dL (ref 70–99)
Potassium: 3.5 mmol/L (ref 3.5–5.1)
Sodium: 139 mmol/L (ref 135–145)

## 2022-07-07 LAB — MAGNESIUM: Magnesium: 1.8 mg/dL (ref 1.7–2.4)

## 2022-07-07 MED ORDER — POTASSIUM CHLORIDE CRYS ER 20 MEQ PO TBCR
40.0000 meq | EXTENDED_RELEASE_TABLET | Freq: Once | ORAL | Status: AC
  Administered 2022-07-07: 40 meq via ORAL
  Filled 2022-07-07: qty 2

## 2022-07-07 MED ORDER — MAGNESIUM SULFATE 2 GM/50ML IV SOLN
2.0000 g | Freq: Once | INTRAVENOUS | Status: AC
  Administered 2022-07-07: 2 g via INTRAVENOUS
  Filled 2022-07-07: qty 50

## 2022-07-07 MED ORDER — ACETAMINOPHEN 500 MG PO TABS: 1000.0000 mg | ORAL_TABLET | Freq: Four times a day (QID) | ORAL | Status: AC | PRN

## 2022-07-07 MED ORDER — LACTATED RINGERS IV BOLUS
1000.0000 mL | Freq: Once | INTRAVENOUS | Status: AC
  Administered 2022-07-07: 1000 mL via INTRAVENOUS

## 2022-07-07 NOTE — Progress Notes (Signed)
Bilateral LE venous duplex study completed. Please see CV Proc for preliminary results.  Renner Sebald BS, RVT 07/07/2022 10:42 AM

## 2022-07-07 NOTE — ED Notes (Addendum)
AVS provided to and discussed with patient and family member at bedside. Pt verbalizes understanding of discharge instructions and denies any questions or concerns at this time. Pt has ride home. Pt ambulated out of department independently with steady gait.  

## 2022-07-07 NOTE — Evaluation (Signed)
Physical Therapy Evaluation Patient Details Name: Audrey Guerra MRN: [id number] DOB: [birth year] Today's Date: 07/07/2022  History of Present Illness  56 yo female with onset of lightheaded feelings and fatigue was admitted 7/24 for evaluation, noted to have dehydration after poor intake post gastric sleeve on 06/30/22.  Pt is referred to PT for mobility evaluation  PMHx:  a-fib, gastric sleeve surgery, HTN, HLD,  Clinical Impression  Pt was seen for evaluation and noted her balance changes which required a RW at baseline.  Pt agrees she would like to address this issue, and is interested in follow up therapy.  Recommend outpatient therapy for strengthening hips and correcting her balance changes, but will recommend her to have HHPT if this is not practical.  Follow acutely as her stay permits.       Recommendations for follow up therapy are one component of a multi-disciplinary discharge planning process, led by the attending physician.  Recommendations may be updated based on patient status, additional functional criteria and insurance authorization.  Follow Up Recommendations Outpatient PT      Assistance Recommended at Discharge Set up Supervision/Assistance  Patient can return home with the following  A little help with walking and/or transfers;A little help with bathing/dressing/bathroom;Assistance with cooking/housework;Assist for transportation;Help with stairs or ramp for entrance    Equipment Recommendations None recommended by PT  Recommendations for Other Services       Functional Status Assessment Patient has had a recent decline in their functional status and demonstrates the ability to make significant improvements in function in a reasonable and predictable amount of time.     Precautions / Restrictions Precautions Precautions: None Precaution Comments: SOB with exertion Restrictions Weight Bearing Restrictions: No      Mobility  Bed Mobility Overal bed  mobility: Needs Assistance Bed Mobility: Supine to Sit, Sit to Supine     Supine to sit: Supervision Sit to supine: Supervision        Transfers Overall transfer level: Needs assistance Equipment used: 1 person hand held assist Transfers: Sit to/from Stand Sit to Stand: Min guard           General transfer comment: min guard for lines and general safety    Ambulation/Gait Ambulation/Gait assistance: Min guard Gait Distance (Feet): 100 Feet Assistive device: IV Pole, 1 person hand held assist Gait Pattern/deviations: Step-through pattern, Decreased stride length, Wide base of support Gait velocity: reduced Gait velocity interpretation: <1.31 ft/sec, indicative of household ambulator Pre-gait activities: standing balance ck General Gait Details: slow guarded pattern, mainly due to generalized weakness  Stairs            Wheelchair Mobility    Modified Rankin (Stroke Patients Only)       Balance Overall balance assessment: Needs assistance Sitting-balance support: Feet supported Sitting balance-Leahy Scale: Fair     Standing balance support: Bilateral upper extremity supported, During functional activity Standing balance-Leahy Scale: Fair Standing balance comment: unsteady with gait on IV pole and more controlled wiht HHA                             Pertinent Vitals/Pain Pain Assessment Pain Assessment: No/denies pain    Home Living Family/patient expects to be discharged to:: Private residence Living Arrangements: Alone Available Help at Discharge: Family;Available PRN/intermittently Type of Home: House Home Access: Stairs to enter   Entrance Stairs-Number of Steps: 3   Home Layout: One level Home Equipment: Agricultural consultant (2 wheels)  Additional Comments: used RW baseline    Prior Function Prior Level of Function : Independent/Modified Independent;Needs assist       Physical Assist : Mobility (physical) Mobility (physical):  Gait   Mobility Comments: RW baseline       Hand Dominance   Dominant Hand: Right    Extremity/Trunk Assessment   Upper Extremity Assessment Upper Extremity Assessment: Overall WFL for tasks assessed    Lower Extremity Assessment Lower Extremity Assessment: Generalized weakness    Cervical / Trunk Assessment Cervical / Trunk Assessment: Other exceptions (had gastric sleeve bypass sx on 7/18)  Communication   Communication: No difficulties  Cognition Arousal/Alertness: Awake/alert Behavior During Therapy: WFL for tasks assessed/performed Overall Cognitive Status: Within Functional Limits for tasks assessed                                          General Comments General comments (skin integrity, edema, etc.): pt is a generally weak pt with hip strength changes that require a RW at baseline.  Her follow up can include therapy, outpatient likely    Exercises     Assessment/Plan    PT Assessment Patient needs continued PT services  PT Problem List Decreased strength;Decreased mobility;Decreased balance       PT Treatment Interventions DME instruction;Gait training;Functional mobility training;Therapeutic activities;Stair training;Therapeutic exercise;Balance training;Neuromuscular re-education;Patient/family education    PT Goals (Current goals can be found in the Care Plan section)  Acute Rehab PT Goals Patient Stated Goal: to get stronger PT Goal Formulation: With patient Time For Goal Achievement: 07/14/22 Potential to Achieve Goals: Good    Frequency Min 3X/week     Co-evaluation               AM-PAC PT "6 Clicks" Mobility  Outcome Measure Help needed turning from your back to your side while in a flat bed without using bedrails?: A Little Help needed moving from lying on your back to sitting on the side of a flat bed without using bedrails?: A Little Help needed moving to and from a bed to a chair (including a wheelchair)?: A  Little Help needed standing up from a chair using your arms (e.g., wheelchair or bedside chair)?: A Little Help needed to walk in hospital room?: A Little Help needed climbing 3-5 steps with a railing? : A Lot 6 Click Score: 17    End of Session Equipment Utilized During Treatment: Gait belt Activity Tolerance: Patient tolerated treatment well Patient left: in bed;with call bell/phone within reach Nurse Communication: Mobility status PT Visit Diagnosis: Unsteadiness on feet (R26.81);Muscle weakness (generalized) (M62.81)    Time: 7253-6644 PT Time Calculation (min) (ACUTE ONLY): 12 min   Charges:   PT Evaluation $PT Eval Moderate Complexity: 1 Mod         Ivar Drape 07/07/2022, 1:32 PM  Samul Dada, PT PhD Acute Rehab Dept. Number: [facility] [id number] and [facility] [phone]

## 2022-07-07 NOTE — Discharge Instructions (Signed)
Follow with Primary MD Tisovec, Adelfa Koh, MD in 7 days so follow-up with your bariatric surgeon within a week.  Monitor your blood pressure twice a day  Get CBC, CMP, 2 view Chest X ray -  checked next visit within 1 week by Primary MD    Activity: As tolerated with Full fall precautions use walker/cane & assistance as needed  Disposition Home   Diet: Diet as suggested by bariatric surgeon  Special Instructions: If you have smoked or chewed Tobacco  in the last 2 yrs please stop smoking, stop any regular Alcohol  and or any Recreational drug use.  On your next visit with your primary care physician please Get Medicines reviewed and adjusted.  Please request your Prim.MD to go over all Hospital Tests and Procedure/Radiological results at the follow up, please get all Hospital records sent to your Prim MD by signing hospital release before you go home.  If you experience worsening of your admission symptoms, develop shortness of breath, life threatening emergency, suicidal or homicidal thoughts you must seek medical attention immediately by calling 911 or calling your MD immediately  if symptoms less severe.  You Must read complete instructions/literature along with all the possible adverse reactions/side effects for all the Medicines you take and that have been prescribed to you. Take any new Medicines after you have completely understood and accpet all the possible adverse reactions/side effects.

## 2022-07-07 NOTE — ED Notes (Signed)
Breakfast order placed ?

## 2022-07-07 NOTE — ED Notes (Signed)
Echo at bedside

## 2022-07-07 NOTE — Discharge Summary (Signed)
Audrey Guerra [id number] DOB: [birth year] DOA: 07/06/2022  PCP: Audrey Pao, MD  Admit date: 07/06/2022  Discharge date: 07/07/2022  Admitted From: Home   Disposition:  Home   Recommendations for Outpatient Follow-up:   Follow up with PCP in 1-2 weeks  PCP Please obtain BMP/CBC, 2 view CXR in 1week,  (see Discharge instructions)   PCP Please follow up on the following pending results: Monitor blood pressure closely adjust medications as needed.   Home Health: None   Equipment/Devices: None  Consultations: None  Discharge Condition: Stable    CODE STATUS: Full    Diet Recommendation: As suggested by her bariatric surgeon.  CC - weakness   Brief history of present illness from the day of admission and additional interim summary     56 y.o. female, with past medical history of atrial fibrillation on beta-blocker and Xarelto, morbid obesity s/p gastric sleeve surgery few days ago, morbid obesity, hypertension, dyslipidemia who presents to the hospital with chief complaints of fatigue and some exertional shortness of breath.   Patient with above history who had a gastric sleeve procedure about a week ago at [facility] long [facility] after which she has not been eating or drinking well for several days, presents when she started experiencing gradually progressive fatigue, lightheadedness upon landing up and some exertional shortness of breath for the last 2 to 3 days, symptoms much worse today, presented to the ER where her work-up was consistent with severe dehydration, hypotension, nonspecific chest discomfort and I was called to admit the patient for dehydration with orthostatic hypotension.  CTA, EKG nonacute, troponin trend flat and in non-ACS pattern.  She did stop her Xarelto around her surgery but has been  resumed 2 days ago.                                                                 Hospital Course    Severe dehydration with hypotension in a patient who recently had gastric sleeve surgery 1 week ago, not eating or drinking well since her surgery and still taking beta-blocker and moderate doses of ARB .  This was a case of severe dehydration worsened by patient being on 2 blood pressure medications, her CTA chest, EKG and troponin trend are all unremarkable.  She had stable echocardiogram as well, after IV fluids for hydration and holding of her blood pressure medications she is much better and wants to go home, at this point I am holding off her blood pressure medications till she follows with her PCP, requested to check her blood pressures twice a day.  She is currently symptom-free and eager to go home.   2.  Exertional shortness of breath.  Likely due to hypotension and hypoperfusion, CTA chest negative, EKG negative, troponin trend is flat and in non-ACS  pattern, she has no chest pain, stable echocardiogram.  Now symptom-free.   3.  Morbid obesity s/p gastric sleeve procedure 1 week ago.  Supportive care   4.  Paroxysmal atrial fibrillation [place] vas 2 score of greater than 3.  For now blood pressure is too low hence beta-blocker will be held, continue Xarelto which was commenced 2 days ago i.e. 4 days after her surgery.  PCP to recheck within a week and resume beta-blocker if blood pressure allows.   5.  Leukocytosis.  Appears to be present since the day of her surgery, she is afebrile, CTA chest negative, will check UA and monitor currently does not appear toxic.   6.  Dyslipidemia.  Continue home dose statin.   7.  GERD.  On PPI.   8.  Elevated D-dimer.  Likely due to recent surgery, CTA chest negative, negative bilateral lower extremity venous duplex.  Already on Xarelto.   Discharge diagnosis     Principal Problem:   Dehydration Active Problems:   Atrial fibrillation (HCC)    Chronic diastolic CHF (congestive heart failure), NYHA class 1 (HCC)   Morbid obesity [facility])    Discharge instructions    Discharge Instructions     Discharge instructions   Complete by: As directed    Follow with Primary MD Tisovec, Fransico Him, MD in 7 days so follow-up with your bariatric surgeon within a week.  Monitor your blood pressure twice a day  Get CBC, CMP, 2 view Chest X ray -  checked next visit within 1 week by Primary MD    Activity: As tolerated with Full fall precautions use walker/cane & assistance as needed  Disposition Home   Diet: Diet as suggested by bariatric surgeon  Special Instructions: If you have smoked or chewed Tobacco  in the last 2 yrs please stop smoking, stop any regular Alcohol  and or any Recreational drug use.  On your next visit with your primary care physician please Get Medicines reviewed and adjusted.  Please request your Prim.MD to go over all Hospital Tests and Procedure/Radiological results at the follow up, please get all Hospital records sent to your Prim MD by signing hospital release before you go home.  If you experience worsening of your admission symptoms, develop shortness of breath, life threatening emergency, suicidal or homicidal thoughts you must seek medical attention immediately by calling 911 or calling your MD immediately  if symptoms less severe.  You Must read complete instructions/literature along with all the possible adverse reactions/side effects for all the Medicines you take and that have been prescribed to you. Take any new Medicines after you have completely understood and accpet all the possible adverse reactions/side effects.   Increase activity slowly   Complete by: As directed        Discharge Medications   Allergies as of 07/07/2022   No Known Allergies      Medication List     STOP taking these medications    hydrochlorothiazide 12.5 MG tablet Commonly known as: HYDRODIURIL   metoprolol  tartrate 25 MG tablet Commonly known as: LOPRESSOR   valsartan 80 MG tablet Commonly known as: DIOVAN       TAKE these medications    acetaminophen 500 MG tablet Commonly known as: TYLENOL Take 2 tablets (1,000 mg total) by mouth every 6 (six) hours as needed for up to 5 days for moderate pain.   acidophilus Caps capsule Take 1 capsule by mouth daily.   hydroxypropyl methylcellulose / hypromellose  2.5 % ophthalmic solution Commonly known as: ISOPTO TEARS / GONIOVISC Place 1 drop into both eyes as needed for dry eyes.   Icy Hot 7.5 % (Roll) Misc Generic drug: Menthol (Topical Analgesic) Apply 1 each topically daily as needed (pain).   ondansetron 4 MG disintegrating tablet Commonly known as: ZOFRAN-ODT Dissolve 1 tablet (4 mg total) by mouth every 6 (six) hours as needed for nausea or vomiting.   pantoprazole 40 MG tablet Commonly known as: PROTONIX Take 1 tablet (40 mg total) by mouth daily.   rosuvastatin 5 MG tablet Commonly known as: CRESTOR Take 5 mg by mouth daily.   Xarelto 20 MG Tabs tablet Generic drug: rivaroxaban Take 1 tablet (20 mg total) by mouth daily.         Follow-up Information     Tisovec, Fransico Him, MD. Schedule an appointment as soon as possible for a visit in 3 week(s).   Specialty: Internal Medicine Contact information: [place] [address] [phone]         Nahser, Wonda Cheng, MD. Schedule an appointment as soon as possible for a visit in 1 week(s).   Specialty: Cardiology Contact information: [address] [place] [address] [phone]                 Major procedures and Radiology Reports - PLEASE review detailed and final reports thoroughly  -       VAS [place] LOWER EXTREMITY VENOUS (DVT)  Result Date: 07/07/2022  Lower Venous DVT Study Patient Name:  Audrey Guerra  Date of Exam:   07/07/2022 Medical Rec #: [id number]           Accession #:    [id number] Date of Birth:  1966-10-13           Patient Gender: F Patient Age:   64 years Exam Location:  [facility] Procedure:      VAS [place] LOWER EXTREMITY VENOUS (DVT) Referring Phys: Deno Etienne [facility] --------------------------------------------------------------------------------  Indications: Elevatd d-dimer, and Swelling.  Performing Technologist: Bobetta Lime BS, RVT  Examination Guidelines: A complete evaluation includes B-mode imaging, spectral Doppler, color Doppler, and power Doppler as needed of all accessible portions of each vessel. Bilateral testing is considered an integral part of a complete examination. Limited examinations for reoccurring indications may be performed as noted. The reflux portion of the exam is performed with the patient in reverse Trendelenburg.  +---------+---------------+---------+-----------+----------+--------------+ RIGHT    CompressibilityPhasicitySpontaneityPropertiesThrombus Aging +---------+---------------+---------+-----------+----------+--------------+ CFV      Full           Yes      Yes                                 +---------+---------------+---------+-----------+----------+--------------+ SFJ      Full                                                        +---------+---------------+---------+-----------+----------+--------------+ FV Prox  Full                                                        +---------+---------------+---------+-----------+----------+--------------+  FV Mid   Full                                                        +---------+---------------+---------+-----------+----------+--------------+ FV DistalFull                                                        +---------+---------------+---------+-----------+----------+--------------+ PFV      Full                                                        +---------+---------------+---------+-----------+----------+--------------+ POP      Full           Yes       Yes                                 +---------+---------------+---------+-----------+----------+--------------+ PTV      Full                                                        +---------+---------------+---------+-----------+----------+--------------+ PERO     Full                                                        +---------+---------------+---------+-----------+----------+--------------+   +---------+---------------+---------+-----------+----------+--------------+ LEFT     CompressibilityPhasicitySpontaneityPropertiesThrombus Aging +---------+---------------+---------+-----------+----------+--------------+ CFV      Full           Yes      Yes                                 +---------+---------------+---------+-----------+----------+--------------+ SFJ      Full                                                        +---------+---------------+---------+-----------+----------+--------------+ FV Prox  Full                                                        +---------+---------------+---------+-----------+----------+--------------+ FV Mid   Full                                                        +---------+---------------+---------+-----------+----------+--------------+  FV DistalFull                                                        +---------+---------------+---------+-----------+----------+--------------+ PFV      Full                                                        +---------+---------------+---------+-----------+----------+--------------+ POP      Full           Yes      Yes                                 +---------+---------------+---------+-----------+----------+--------------+ PTV      Full                                                        +---------+---------------+---------+-----------+----------+--------------+ PERO     Full                                                         +---------+---------------+---------+-----------+----------+--------------+    Summary: BILATERAL: - No evidence of deep vein thrombosis seen in the lower extremities, bilaterally. -No evidence of popliteal cyst, bilaterally.   *See table(s) above for measurements and observations.    Preliminary    ECHOCARDIOGRAM COMPLETE  Result Date: 07/07/2022    ECHOCARDIOGRAM REPORT   Patient Name:   Audrey Guerra Date of Exam: 07/07/2022 Medical Rec #:  [id number]          Height:       65.0 in Accession #:    [id number]         Weight:       227.1 lb Date of Birth:  09-27-1966          BSA:          2.088 m Patient Age:    56 years           BP:           113/89 mmHg Patient Gender: F                  HR:           62 bpm. Exam Location:  Inpatient Procedure: 2D Echo, Cardiac Doppler and Color Doppler Indications:     CHF-Acute Diastolic I50.31  History:         Patient has no prior history of Echocardiogram examinations.                  Cardiomegaly, Arrythmias:Atrial Fibrillation; Risk                  Factors:Dyslipidemia and Hypertension.  Sonographer:     Eulah Pont RDCS Referring Phys:  Effie Shy [facility] Diagnosing Phys:  [place] IMPRESSIONS  1. Left ventricular ejection fraction, by estimation, is 55 to 60%. The left ventricle has normal function. The left ventricle has no regional wall motion abnormalities. Left ventricular diastolic parameters were normal.  2. Right ventricular systolic function is normal. The right ventricular size is normal. Tricuspid regurgitation signal is inadequate for assessing PA pressure.  3. The mitral valve is normal in structure. No evidence of mitral valve regurgitation.  4. The aortic valve is tricuspid. Aortic valve regurgitation is not visualized.  5. The inferior vena cava is normal in size with greater than 50% respiratory variability, suggesting right atrial pressure of 3 mmHg. Comparison(s): No prior Echocardiogram. Conclusion(s)/Recommendation(s): Normal  biventricular function without evidence of hemodynamically significant valvular heart disease. FINDINGS  Left Ventricle: Left ventricular ejection fraction, by estimation, is 55 to 60%. The left ventricle has normal function. The left ventricle has no regional wall motion abnormalities. The left ventricular internal cavity size was normal in size. There is  no left ventricular hypertrophy. Left ventricular diastolic parameters were normal. Right Ventricle: The right ventricular size is normal. Right vetricular wall thickness was not well visualized. Right ventricular systolic function is normal. Tricuspid regurgitation signal is inadequate for assessing PA pressure. Left Atrium: Left atrial size was normal in size. Right Atrium: Right atrial size was normal in size. Pericardium: There is no evidence of pericardial effusion. Mitral Valve: The mitral valve is normal in structure. No evidence of mitral valve regurgitation. Tricuspid Valve: The tricuspid valve is normal in structure. Tricuspid valve regurgitation is not demonstrated. Aortic Valve: The aortic valve is tricuspid. Aortic valve regurgitation is not visualized. Pulmonic Valve: The pulmonic valve was not well visualized. Pulmonic valve regurgitation is not visualized. Aorta: The aortic root and ascending aorta are structurally normal, with no evidence of dilitation. Venous: The inferior vena cava is normal in size with greater than 50% respiratory variability, suggesting right atrial pressure of 3 mmHg. IAS/Shunts: No atrial level shunt detected by color flow Doppler.  LEFT VENTRICLE PLAX 2D LVIDd:         4.50 cm LVIDs:         3.20 cm LV PW:         0.80 cm LV IVS:        0.90 cm LVOT diam:     2.00 cm LV SV:         93 LV SV Index:   45 LVOT Area:     3.14 cm  LV Volumes (MOD) LV vol d, MOD A2C: 74.0 ml LV vol d, MOD A4C: 63.4 ml LV vol s, MOD A2C: 28.9 ml LV vol s, MOD A4C: 29.7 ml LV SV MOD A2C:     45.1 ml LV SV MOD A4C:     63.4 ml LV SV MOD BP:       40.8 ml RIGHT VENTRICLE RV S prime:     12.20 cm/s TAPSE (M-mode): 2.2 cm LEFT ATRIUM             Index        RIGHT ATRIUM           Index LA diam:        4.40 cm 2.11 cm/m   RA Area:     14.00 cm LA Vol (A2C):   67.4 ml 32.28 ml/m  RA Volume:   32.60 ml  15.61 ml/m LA Vol (A4C):   56.3 ml 26.97 ml/m LA Biplane Vol: 62.1 ml 29.74 ml/m  AORTIC VALVE LVOT  Vmax:   127.00 cm/s LVOT Vmean:  81.933 cm/s LVOT VTI:    0.297 m  AORTA Ao Root diam: 2.90 cm Ao Asc diam:  3.10 cm  SHUNTS Systemic VTI:  0.30 m Systemic Diam: 2.00 cm Audrey Guerra Electronically signed by Audrey Guerra Signature Date/Time: 07/07/2022/10:20:08 AM    Final (Updated)    CT Angio Chest PE W/Cm &/Or Wo Cm  Result Date: 07/06/2022 CLINICAL DATA:  Pulmonary embolism (PE) suspected, high prob EXAM: CT ANGIOGRAPHY CHEST WITH CONTRAST TECHNIQUE: Multidetector CT imaging of the chest was performed using the standard protocol during bolus administration of intravenous contrast. Multiplanar CT image reconstructions and MIPs were obtained to evaluate the vascular anatomy. RADIATION DOSE REDUCTION: This exam was performed according to the departmental dose-optimization program which includes automated exposure control, adjustment of the mA and/or kV according to patient size and/or use of iterative reconstruction technique. CONTRAST:  36mL OMNIPAQUE IOHEXOL 350 MG/ML SOLN COMPARISON:  None Available. FINDINGS: Cardiovascular: Satisfactory opacification of the pulmonary arteries to the segmental level. No evidence of pulmonary embolism. Normal cardiac size.No pericardial disease. Mild atherosclerosis of the thoracic aorta. Mediastinum/Nodes: No lymphadenopathy. The thyroid is unremarkable. Esophagus is unremarkable. Lungs/Pleura: Bibasilar subsegmental atelectasis, left greater than right. No other airspace disease. Mild bronchial wall thickening. No pneumothorax. No pleural effusion. Mild centrilobular emphysema. Upper Abdomen: No acute abnormality.  Colonic diverticula noted. Prior gastric surgery with mild adjacent left upper quadrant stranding, likely postoperative. Musculoskeletal: No chest wall abnormality. No acute or significant osseous findings. Review of the MIP images confirms the above findings. IMPRESSION: No evidence of pulmonary embolism. Bibasilar subsegmental atelectasis, left greater than right. Electronically Signed   By: Maurine Simmering M.D.   On: 07/06/2022 16:12   DG Chest 2 View  Result Date: 07/06/2022 CLINICAL DATA:  Shortness of breath EXAM: CHEST - 2 VIEW COMPARISON:  None Available. FINDINGS: Cardiac and mediastinal contours are within normal limits. No focal pulmonary opacity. No pleural effusion or pneumothorax. No acute osseous abnormality. IMPRESSION: No acute cardiopulmonary process. Electronically Signed   By: Merilyn Baba M.D.   On: 07/06/2022 12:34   DG Chest 2 View  Result Date: 06/23/2022 CLINICAL DATA:  Preoperative evaluation. EXAM: CHEST - 2 VIEW COMPARISON:  Chest radiograph 09/13/2018 FINDINGS: The heart size and mediastinal contours are within normal limits. Both lungs are clear. The visualized skeletal structures are unremarkable. IMPRESSION: No active cardiopulmonary disease. Electronically Signed   By: Lovey Newcomer M.D.   On: 06/23/2022 12:56       Today   Subjective    Audrey Guerra today has no headache,no chest abdominal pain,no new weakness tingling or numbness, feels much better wants to go home today.     Objective   Blood pressure 120/63, pulse 60, temperature 98 F (36.7 C), temperature source Oral, resp. rate 18, height 5\' 5"  (1.651 m), weight 103 kg, SpO2 100 %.   Intake/Output Summary (Last 24 hours) at 07/07/2022 1123 Last data filed at 07/07/2022 1021 Gross per 24 hour  Intake 4543.03 ml  Output --  Net 4543.03 ml    Exam  Awake Alert, No new F.N deficits,    [place].AT,PERRAL Supple Neck,   Symmetrical Chest wall movement, Good air movement bilaterally, CTAB RRR,No  Gallops,   +ve B.Sounds, Abd Soft, Non tender,  No Cyanosis, Clubbing or edema    Data Review   Recent Labs  Lab 06/30/22 1907 07/01/22 0509 07/06/22 1240 07/07/22 0420  WBC 14.8* 12.1* 13.3* 9.1  HGB 13.4 12.6  14.2 12.4  HCT 40.4 38.0 42.9 35.1*  PLT 244 252 320 243  MCV 91.6 91.1 90.9 88.9  MCH 30.4 30.2 30.1 31.4  MCHC 33.2 33.2 33.1 35.3  RDW 12.5 12.4 12.3 12.5  LYMPHSABS  --  0.9 2.2 2.3  MONOABS  --  0.6 1.0 0.7  EOSABS  --  0.0 0.2 0.3  BASOSABS  --  0.0 0.1 0.0    Recent Labs  Lab 06/30/22 1907 07/06/22 1240 07/06/22 1409 07/07/22 0420  NA  --   --  137 139  K  --   --  3.8 3.5  CL  --   --  98 106  CO2  --   --  24 23  GLUCOSE  --   --  94 84  BUN  --   --  21* 12  CREATININE 0.90  --  0.99 0.84  CALCIUM  --   --  9.3 8.6*  MG  --   --   --  1.8  DDIMER  --  3.60*  --   --   BNP  --  114.1*  --   --     Total Time in preparing paper work, data evaluation and todays exam - 75 minutes  Lala Lund M.D on 07/07/2022 at 11:23 AM  [facility]

## 2022-07-07 NOTE — ED Notes (Signed)
Pt ambulated to BR and back to room without difficulty. Pt tolerated well.

## 2022-07-07 NOTE — ED Notes (Signed)
Pt transported to US at this time. 

## 2022-07-07 NOTE — Progress Notes (Signed)
  Echocardiogram 2D Echocardiogram has been performed.  Augustine Radar 07/07/2022, 8:32 AM

## 2022-07-08 ENCOUNTER — Telehealth (HOSPITAL_COMMUNITY): Payer: Self-pay | Admitting: *Deleted

## 2022-07-08 LAB — URINE CULTURE

## 2022-07-08 NOTE — Telephone Encounter (Signed)
Called to f/u with pt after recent [facility] ED visit on Monday 07/06/22. Pt states that she feels "so much better".  Pt states that her BP and potassium was low.  She was given IVFs and PO potassium.  Pt states that she is able to meet fluid (3- 16.9 floz bottles of water) and protein (2- 30g shakes) goals.  Pt is now only taking losartan and not taking the HCTZ. Pt is now checking her BP, stating it was 126/87 at 0830 this am.  Instructed pt to monitor BP twice daily and to f/u with PCP with BP log. Will continue to monitor as needed.

## 2022-07-13 ENCOUNTER — Encounter: Payer: Self-pay | Admitting: Cardiovascular Disease

## 2022-07-13 NOTE — Progress Notes (Unsigned)
Cardiology Office Note:    Date:  07/14/2022   ID:  Audrey Guerra, DOB 1966-01-29, MRN [id number]  PCP:  Gaspar Garbe, MD   [facility] Providers Cardiologist:  Kristeen Miss, MD     Referring MD: Gaspar Garbe, MD   Chief Complaint  Patient presents with   Congestive Heart Failure          Aug. 1, 2022   Audrey Guerra is a 56 y.o. female with a hx of morbid obesity, HTN, has a history of a cardiomyopathy.  She was previously seeing a cardiologist in [facility] but now he is out of network.  She has had an echocardiogram about a year ago. Echocardiogram from July 12, 2020 done at [facility] reveals normal left ventricular systolic function with ejection fraction of 55 to 60%.  She has grade 1 diastolic dysfunction.  No significant valvular abnormalities.  Is not very active , limited by arthritis  Works as a Runner, broadcasting/film/video IT sales professional)   No angina  Breathing is ok Limited by pain from arthritis   I spoke with Dr. Kennon Rounds office ( [facility]  [place])  An event monitor showed PAF She was started on Xarelto and metoprolol following that event monitor    Aug. 1, 2023  Seen for follow up of her PAF, obesity, s/p gastric sleeve surgery , HTN, hyperlipidemia Bariatric surgery June 30, 2022  [name] was seen in the ER with heart racing and sweats, dyspnea  Had gastric sleeve surgery several weeks ago at [facility].  Has not been eating or drinking well for several days Has had fatigue, lightheadedness, exertional DOE   Wt is 222 lbs   Metoprolol was stopped Valsartan was stopped , HCTZ was stopped  Wt is 222 lbs  ( down 40 lbs since last year)   Is walking better  No dyspnea    Past Medical History:  Diagnosis Date   Atrial fibrillation (HCC)    BMI 40.0-44.9, adult (HCC)    Cardiomegaly    Hyperlipidemia    Hypertension    Morbid obesity (HCC)    Osteoarthritis    PTSD (post-traumatic stress disorder)     Past Surgical History:   Procedure Laterality Date   CESAREAN SECTION     2 previous c-sections   LAPAROSCOPIC GASTRIC SLEEVE RESECTION N/A 06/30/2022   Procedure: LAPAROSCOPIC SLEEVE GASTRECTOMY;  Surgeon: Rodman Pickle, MD;  Location: [facility];  Service: General;  Laterality: N/A;   UPPER GI ENDOSCOPY N/A 06/30/2022   Procedure: UPPER GI ENDOSCOPY;  Surgeon: Rodman Pickle, MD;  Location: [facility];  Service: General;  Laterality: N/A;    Current Medications: Current Meds  Medication Sig   acetaminophen (TYLENOL) 500 MG tablet 1 tablet as needed Orally every 6 hrs   acidophilus (RISAQUAD) CAPS capsule Take 1 capsule by mouth daily.   hydroxypropyl methylcellulose / hypromellose (ISOPTO TEARS / GONIOVISC) 2.5 % ophthalmic solution Place 1 drop into both eyes as needed for dry eyes.   Menthol, Topical Analgesic, (ICY HOT) 7.5 % (Roll) MISC Apply 1 each topically daily as needed (pain).   ondansetron (ZOFRAN-ODT) 4 MG disintegrating tablet Dissolve 1 tablet (4 mg total) by mouth every 6 (six) hours as needed for nausea or vomiting.   pantoprazole (PROTONIX) 40 MG tablet Take 1 tablet (40 mg total) by mouth daily.   rivaroxaban (XARELTO) 20 MG TABS tablet Take 1 tablet (20 mg total) by mouth daily.   rosuvastatin (CRESTOR) 5  MG tablet Take 5 mg by mouth daily.     Allergies:   Patient has no known allergies.   Social History   Socioeconomic History   Marital status: Single    Spouse name: Not on file   Number of children: Not on file   Years of education: Not on file   Highest education level: Not on file  Occupational History   Not on file  Tobacco Use   Smoking status: Former   Smokeless tobacco: Never  Vaping Use   Vaping Use: Never used  Substance and Sexual Activity   Alcohol use: Not Currently    Comment: social   Drug use: No   Sexual activity: Yes    Birth control/protection: Condom  Other Topics Concern   Not on file  Social History Narrative   Not on file   Social  Determinants of Health   Financial Resource Strain: Not on file  Food Insecurity: Not on file  Transportation Needs: Not on file  Physical Activity: Not on file  Stress: Not on file  Social Connections: Not on file     Family History: The patient's family history includes Diabetes in her father and mother.  ROS:   Please see the history of present illness.     All other systems reviewed and are negative.  EKGs/Labs/Other Studies Reviewed:    The following studies were reviewed today: Records from Dr. Osborne Casco and from cardiologist in [facility] , including echo report   EKG:     Recent Labs: 06/23/2022: ALT 16 07/06/2022: B Natriuretic Peptide 114.1 07/07/2022: BUN 12; Creatinine, Ser 0.84; Hemoglobin 12.4; Magnesium 1.8; Platelets 243; Potassium 3.5; Sodium 139  Recent Lipid Panel No results found for: "CHOL", "TRIG", "HDL", "CHOLHDL", "VLDL", "LDLCALC", "LDLDIRECT"   Risk Assessment/Calculations:           Physical Exam:    Physical Exam: Blood pressure 120/80, pulse 63, height 5\' 5"  (1.651 m), weight 222 lb 12.8 oz (101.1 kg), SpO2 98 %.  GEN:  Well nourished, well developed in no acute distress HEENT: Normal NECK: No JVD; No carotid bruits LYMPHATICS: No lymphadenopathy CARDIAC: RRR , no murmurs, rubs, gallops RESPIRATORY:  Clear to auscultation without rales, wheezing or rhonchi  ABDOMEN: Soft, non-tender, non-distended MUSCULOSKELETAL:  No edema; No deformity  SKIN: Warm and dry NEUROLOGIC:  Alert and oriented x 3   ASSESSMENT:    1. Paroxysmal atrial fibrillation (HCC)   2. Chronic diastolic CHF (congestive heart failure), NYHA class 1 (HCC)   3. Class II obesity     PLAN:        Chronic diastolic CHF :   Echo from July 2021 shows normal LV systolic function.  Grade 1 DD.  Normal LV size.    She denies any shortness of breath.    2.   Paroxysmal atrial fib:  is on Xarelto.   Has not had any recurrent episodes of Afib as far as she knows.     3.  Morbid obesity;  s/p bariatric surgery .   Has lost 40 lbs   4. HTN: She is stopped her valsartan, HCTZ, metoprolol.  Her blood pressure is now normal.  At this point does not appear that there is any reason for her to restart the blood pressure medicines.  I encouraged her to continue with improved diet and with her weight loss.   Medication Adjustments/Labs and Tests Ordered: Current medicines are reviewed at length with the patient today.  Concerns regarding medicines  are outlined above.  No orders of the defined types were placed in this encounter.  No orders of the defined types were placed in this encounter.    Patient Instructions  Medication Instructions:  Your physician recommends that you continue on your current medications as directed. Please refer to the Current Medication list given to you today.  *If you need a refill on your cardiac medications before your next appointment, please call your pharmacy*   Lab Work: NONE If you have labs (blood work) drawn today and your tests are completely normal, you will receive your results only by: MyChart Message (if you have MyChart) OR A paper copy in the mail If you have any lab test that is abnormal or we need to change your treatment, we will call you to review the results.   Testing/Procedures: NONE   Follow-Up: At [facility], you and your health needs are our priority.  As part of our continuing mission to provide you with exceptional heart care, we have created designated Provider Care Teams.  These Care Teams include your primary Cardiologist (physician) and Advanced Practice Providers (APPs -  Physician Assistants and Nurse Practitioners) who all work together to provide you with the care you need, when you need it.  Your next appointment:   1 year(s)  The format for your next appointment:   In Person  Provider:   Louann Sjogren, or [employer] {     Important Information About Sugar          Signed, Kristeen Miss, MD  07/14/2022 9:47 AM    [facility]

## 2022-07-14 ENCOUNTER — Encounter: Payer: Self-pay | Admitting: Dietician

## 2022-07-14 ENCOUNTER — Ambulatory Visit (INDEPENDENT_AMBULATORY_CARE_PROVIDER_SITE_OTHER): Payer: Commercial Managed Care - HMO | Admitting: Cardiovascular Disease

## 2022-07-14 ENCOUNTER — Encounter: Payer: Commercial Managed Care - HMO | Attending: General Surgery | Admitting: Dietician

## 2022-07-14 ENCOUNTER — Encounter: Payer: Self-pay | Admitting: Cardiovascular Disease

## 2022-07-14 VITALS — BP 120/80 | HR 63 | Ht 65.0 in | Wt 222.8 lb

## 2022-07-14 DIAGNOSIS — I5032 Chronic diastolic (congestive) heart failure: Secondary | ICD-10-CM

## 2022-07-14 DIAGNOSIS — E669 Obesity, unspecified: Secondary | ICD-10-CM

## 2022-07-14 DIAGNOSIS — I480 Paroxysmal atrial fibrillation: Secondary | ICD-10-CM | POA: Diagnosis not present

## 2022-07-14 NOTE — Progress Notes (Signed)
2 Week Post-Operative Nutrition Class   Patient was seen on 07/14/2022 for Post-Operative Nutrition education at the [facility].    Surgery date: 06/30/2022 Surgery type: RYGB  Anthropometrics  Start weight at [facility]: 262.4 lbs (date: 07/14/2021)  Height: 64 in Weight today: 224.1 lbs. BMI: 38.47 kg/m2     Clinical  Medical hx: anxiety,  Medications: see list  Labs: none in EMR Notable signs/symptoms: none Any previous deficiencies? No     The following the learning objectives were met by the patient during this course: Identifies Phase 3 (Soft, High Proteins) Dietary Goals and will begin from 2 weeks post-operatively to 2 months post-operatively Identifies appropriate sources of fluids and proteins  Identifies appropriate fat sources and healthy verses unhealthy fat types   States protein recommendations and appropriate sources post-operatively Identifies the need for appropriate texture modifications, mastication, and bite sizes when consuming solids Identifies appropriate fat consumption and sources Identifies appropriate multivitamin and calcium sources post-operatively Describes the need for physical activity post-operatively and will follow MD recommendations States when to call healthcare provider regarding medication questions or post-operative complications   Handouts given during class include: Phase 3A: Soft, High Protein Diet Handout Phase 3 High Protein Meals Healthy Fats   Follow-Up Plan: Patient will follow-up at [facility] in 6 weeks for 2 month post-op nutrition visit for diet advancement per MD.

## 2022-07-14 NOTE — Patient Instructions (Signed)
Medication Instructions:  Your physician recommends that you continue on your current medications as directed. Please refer to the Current Medication list given to you today.  *If you need a refill on your cardiac medications before your next appointment, please call your pharmacy*   Lab Work: NONE If you have labs (blood work) drawn today and your tests are completely normal, you will receive your results only by: MyChart Message (if you have MyChart) OR A paper copy in the mail If you have any lab test that is abnormal or we need to change your treatment, we will call you to review the results.   Testing/Procedures: NONE   Follow-Up: At [facility], you and your health needs are our priority.  As part of our continuing mission to provide you with exceptional heart care, we have created designated Provider Care Teams.  These Care Teams include your primary Cardiologist (physician) and Advanced Practice Providers (APPs -  Physician Assistants and Nurse Practitioners) who all work together to provide you with the care you need, when you need it.  Your next appointment:   1 year(s)  The format for your next appointment:   In Person  Provider:   Nah[facility], [facility], or [employer] {    Important Information About Sugar       

## 2022-07-20 ENCOUNTER — Telehealth: Payer: Self-pay | Admitting: Dietician

## 2022-07-20 NOTE — Telephone Encounter (Signed)
RD called pt to verify fluid intake once starting soft, solid proteins 2 week post-bariatric surgery.   Daily Fluid intake: 64 oz Daily Protein intake: 60+ oz (supplementing with shakes) Bowel Habits: no issues  Concerns/issues:   taking her too long to eat.   Dietitian emphasized eating for 30 minutes and step away from the food, coming back 2-3 hours later.

## 2022-08-25 ENCOUNTER — Encounter: Payer: Self-pay | Admitting: Skilled Nursing Facility1

## 2022-08-25 ENCOUNTER — Encounter: Payer: Commercial Managed Care - HMO | Attending: General Surgery | Admitting: Skilled Nursing Facility1

## 2022-08-25 DIAGNOSIS — E669 Obesity, unspecified: Secondary | ICD-10-CM | POA: Insufficient documentation

## 2022-08-25 NOTE — Progress Notes (Signed)
Bariatric Nutrition Follow-Up Visit Medical Nutrition Therapy   NUTRITION ASSESSMENT    Surgery date: 06/30/2022 Surgery type: RYGB  Anthropometrics  Start weight at [facility]: 262.4 lbs (date: 07/14/2021)  Weight: 213.3   Clinical  Medical hx: anxiety Medications: see list  Labs: none in EMR Notable signs/symptoms: none Any previous deficiencies? No  Body Composition Scale 08/25/2022  Current Body Weight 213.3  Total Body Fat % 42.6  Visceral Fat 14  Fat-Free Mass % 57.3   Total Body Water % 43.1  Muscle-Mass lbs 29.9  BMI 36.5  Body Fat Displacement          Torso  lbs 56.2         Left Leg  lbs 11.2         Right Leg  lbs 11.2         Left Arm  lbs 5.6         Right Arm   lbs 5.6      Lifestyle & Dietary Hx    Pt states she did have an issue with blood pressure and dehydration but is feeling much better now.  Pt states she wants to be more active.  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   Pt state she has been eating tater tots.   Pt sttaes the diet is just too much and cannot follow it and stays confused.    Estimated daily fluid intake: 64 oz Estimated daily protein intake: 60 g Supplements: multi and calcium Current average weekly physical activity: Saturday    24-Hr Dietary Recall First Meal: 8:30-9: protein shake Snack:   Second Meal: 4 shrimp  Snack:  peanuts  Third Meal: tater tots Snack:  Beverages: water  Post-Op Goals/ Signs/ Symptoms Using straws: no Drinking while eating: no Chewing/swallowing difficulties: no Changes in vision: no Changes to mood/headaches: no Hair loss/changes to skin/nails: no Difficulty focusing/concentrating: no Sweating: no Limb weakness: no Dizziness/lightheadedness: no Palpitations: no  Carbonated/caffeinated beverages: no N/V/D/C/Gas: no Abdominal pain: no Dumping syndrome: no    NUTRITION DIAGNOSIS  Overweight/obesity ([place]-3.3) related to past poor dietary habits and  physical inactivity as evidenced by completed bariatric surgery and following dietary guidelines for continued weight loss and healthy nutrition status.     NUTRITION INTERVENTION Nutrition counseling (C-1) and education (E-2) to facilitate bariatric surgery goals, including: The importance of consuming adequate calories as well as certain nutrients daily due to the body's need for essential vitamins, minerals, and fats The importance of daily physical activity and to reach a goal of at least 150 minutes of moderate to vigorous physical activity weekly (or as directed by their physician) due to benefits such as increased musculature and improved lab values The importance of intuitive eating specifically learning hunger-satiety cues and understanding the importance of learning a new body: The importance of mindful eating to avoid grazing behaviors  Encouraged patient to honor their body's internal hunger and fullness cues.  Throughout the day, check in mentally and rate hunger. Stop eating when satisfied not full regardless of how much food is left on the plate.  Get more if still hungry 20-30 minutes later.  The key is to honor satisfaction so throughout the meal, rate fullness factor and stop when comfortably satisfied not physically full. The key is to honor hunger and fullness without any feelings of guilt or shame.  Pay attention to what the internal cues are, rather than any external factors. This will enhance the confidence you have  in listening to your own body and following those internal cues enabling you to increase how often you eat when you are hungry not out of appetite and stop when you are satisfied not full.  Encouraged pt to continue to eat balanced meals inclusive of non starchy vegetables 2 times a day 7 days a week Encouraged pt to choose lean protein sources: limiting beef, pork, sausage, hotdogs, and lunch meat Encourage pt to choose healthy fats such as plant based limiting animal  fats Encouraged pt to continue to drink a minium 64 fluid ounces with half being plain water to satisfy proper hydration   Goals: Find a flow maybe try walking after work or going to the gym Choose complex carbohydrates the baked potato with the skin over the tater tot for example Choose whole foods instead of the processed food the whole apple with the skin over the apple juice  Handouts Provided Include  Detailed myPlate  Learning Style & Readiness for Change Teaching method utilized: Visual & Auditory  Demonstrated degree of understanding via: Teach Back  Readiness Level: action Barriers to learning/adherence to lifestyle change: strictness of diet  RD's Notes for Next Visit Assess adherence to pt chosen goals    MONITORING & EVALUATION Dietary intake, weekly physical activity, body weight  Next Steps Patient is to follow-up in 3 months

## 2022-10-27 ENCOUNTER — Ambulatory Visit: Payer: 59 | Admitting: Cardiovascular Disease

## 2022-10-30 ENCOUNTER — Ambulatory Visit: Payer: 59 | Admitting: Cardiovascular Disease

## 2022-11-09 ENCOUNTER — Telehealth: Payer: Self-pay | Admitting: Cardiovascular Disease

## 2022-11-09 NOTE — Telephone Encounter (Signed)
Patient reports SOB and has to sleep sitting up. She reports a 2-3 lb weight gain over 2 days. She has a history of CHF but is not on any diuretics. Schedule appointment for 11/11/22.  Weigh daily in the morning after using restroom before getting dressed. Monitor for weight gain of 2lbs over night and bring her weights with her to appointment. Also provided education on decreasing sodium intake and when to seek emergency care. Patient verbalized understanding.

## 2022-11-09 NOTE — Telephone Encounter (Signed)
Pt c/o Shortness Of Breath: STAT if SOB developed within the last 24 hours or pt is noticeably SOB on the phone  1. Are you currently SOB (can you hear that pt is SOB on the phone)? Yes   2. How long have you been experiencing SOB? 3 days   3. Are you SOB when sitting or when up moving around? Both   4. Are you currently experiencing any other symptoms? Can't sleep on back or left side; left ankle swollen, sleep in upright position;

## 2022-11-10 NOTE — Progress Notes (Unsigned)
Cardiology Office Note:    Date:  11/11/2022   ID:  Audrey Guerra, DOB 1966/09/09, MRN [id number]  PCP:  Haywood Pao, MD  [facility] Cardiologist:  Mertie Moores, MD     Referring MD: Haywood Pao, MD   Chief Complaint:  No chief complaint on file.     History of Present Illness:   Audrey Guerra is a 56 y.o. female with a hx of morbid obesity, HTN,  history of a cardiomyopathy, gastric sleeve surgery, HTN, HLD  .   Echocardiogram from July 12, 2020 done at [facility] reveals normal left ventricular systolic function with ejection fraction of 55 to 60%. She has grade 1 diastolic dysfunction. No significant valvular abnormalities.   Dr. Barbaraann Boys office ( [facility])  An event monitor showed PAF-She was started on Xarelto and metoprolol following that event monitor   Patient had bariatric surgery 06/30/22 and hadn't been eating or drinking well. Metoprolol, valsartan and HCTZ stopped. Weight was down 40 lbs to 222 lbs.   Patient saw Dr. Acie Fredrickson 07/14/22 and doing well. No changes made.  Patient added onto my schedule for increase SOB and weight gain. Just walking from car to front door she is SOB and exhausted. At night she is having anxiety/panic attacks with heart racing. Gets short of breath laying on her back or left side. Having to sit up or prop up on pillows to sleep. She has gained 5 lbs over Thanksgiving from extra salt. Weight was 200 lbs and now 205 lbs. Under a lot of stress, crying. 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 She spoke with employer to adjust.     Past Medical History:  Diagnosis Date   Atrial fibrillation ([place])    BMI 40.0-44.9, adult ([place])    Cardiomegaly    Hyperlipidemia    Hypertension    Morbid obesity ([place])    Osteoarthritis    PTSD (post-traumatic stress disorder)    Current Medications: Current Meds  Medication Sig   furosemide (LASIX) 20  MG tablet Take 1 tablet (20 mg total) by mouth daily.   metoprolol tartrate (LOPRESSOR) 25 MG tablet Take 1 tablet (25 mg total) by mouth 2 (two) times daily.   pantoprazole (PROTONIX) 40 MG tablet Take 1 tablet (40 mg total) by mouth daily.   rivaroxaban (XARELTO) 20 MG TABS tablet Take 1 tablet (20 mg total) by mouth daily.   rosuvastatin (CRESTOR) 5 MG tablet Take 5 mg by mouth daily.    Allergies:   Patient has no known allergies.   Social History   Tobacco Use   Smoking status: Former   Smokeless tobacco: Never  Scientific laboratory technician Use: Never used  Substance Use Topics   Alcohol use: Not Currently    Comment: social   Drug use: No    Family Hx: The patient's family history includes Diabetes in her father and mother.  ROS     Physical Exam:    VS:  BP 124/80   Pulse 77   Ht 5' 5.75" (1.67 m)   Wt 205 lb (93 kg)   SpO2 99%   BMI 33.34 kg/m     Wt Readings from Last 3 Encounters:  11/11/22 205 lb (93 kg)  08/25/22 213 lb 4.8 oz (96.8 kg)  07/14/22 224 lb 1.6 oz (101.7 kg)    Physical Exam  GEN: Obese, in no acute  distress  Neck: slight increase JVD, no carotid bruits, or masses Cardiac: irreg irreg 160/m; no murmurs, rubs, or gallops  Respiratory:  clear to auscultation bilaterally, normal work of breathing GI: soft, nontender, nondistended, + BS Ext: trace edema, without cyanosis, clubbing, Good distal pulses bilaterally Neuro:  Alert and Oriented x 3,  Psych: euthymic mood, full affect        EKGs/Labs/Other Test Reviewed:    EKG:  EKG is   ordered today.  The ekg ordered today demonstrates Afib 164/m  Recent Labs: 06/23/2022: ALT 16 07/06/2022: B Natriuretic Peptide 114.1 07/07/2022: BUN 12; Creatinine, Ser 0.84; Hemoglobin 12.4; Magnesium 1.8; Platelets 243; Potassium 3.5; Sodium 139   Recent Lipid Panel No results for input(s): "CHOL", "TRIG", "HDL", "VLDL", "LDLCALC", "LDLDIRECT" in the last 8760 hours.   Prior CV Studies:     Echo  06/2022 IMPRESSIONS     1. Left ventricular ejection fraction, by estimation, is 55 to 60%. The  left ventricle has normal function. The left ventricle has no regional  wall motion abnormalities. Left ventricular diastolic parameters were  normal.   2. Right ventricular systolic function is normal. The right ventricular  size is normal. Tricuspid regurgitation signal is inadequate for assessing  PA pressure.   3. The mitral valve is normal in structure. No evidence of mitral valve  regurgitation.   4. The aortic valve is tricuspid. Aortic valve regurgitation is not  visualized.   5. The inferior vena cava is normal in size with greater than 50%  respiratory variability, suggesting right atrial pressure of 3 mmHg.   Comparison(s): No prior Echocardiogram.   Conclusion(s)/Recommendation(s): Normal biventricular function without  evidence of hemodynamically significant valvular heart disease.   FINDINGS   Left Ventricle: Left ventricular ejection fraction, by estimation, is 55  to 60%. The left ventricle has normal function. The left ventricle has no  regional wall motion abnormalities. The left ventricular internal cavity  size was normal in size. There is   no left ventricular hypertrophy. Left ventricular diastolic parameters  were normal.   Right Ventricle: The right ventricular size is normal. Right vetricular  wall thickness was not well visualized. Right ventricular systolic  function is normal. Tricuspid regurgitation signal is inadequate for  assessing PA pressure.   Left [facility]: Left atrial size was normal in size.   Right [facility]: Right atrial size was normal in size.   Pericardium: There is no evidence of pericardial effusion.   Mitral Valve: The mitral valve is normal in structure. No evidence of  mitral valve regurgitation.   Tricuspid Valve: The tricuspid valve is normal in structure. Tricuspid  valve regurgitation is not demonstrated.   Aortic Valve: The  aortic valve is tricuspid. Aortic valve regurgitation is  not visualized.   Pulmonic Valve: The pulmonic valve was not well visualized. Pulmonic valve  regurgitation is not visualized.   Aorta: The aortic root and ascending aorta are structurally normal, with  no evidence of dilitation.   Venous: The inferior vena cava is normal in size with greater than 50%  respiratory variability, suggesting right atrial pressure of 3 mmHg.    Risk Assessment/Calculations/Metrics:    CHA2DS2-VASc Score = 3   This indicates a 3.2% annual risk of stroke. The patient's score is based upon: CHF History: 1 HTN History: 1 Diabetes History: 0 Stroke History: 0 Vascular Disease History: 0 Age Score: 0 Gender Score: 1             ASSESSMENT & PLAN:  No problem-specific Assessment & Plan notes found for this encounter.   Acute on Chronic diastolic heart failure: -suspect secondary to rapid Afib and extra salt over the holiday -2D echo completed 06/2022 with EF of 55-60%, normal diastolic function -Low sodium diet, fluid restriction <2L, and daily weights encouraged.   -Lasix 20 mg for 3 days starting tomorrow then prn -check bmet cbc and tsh today   Paroxysmal atrial fibrillation with RVR HR 164/m: -She is currently off metoprolol 25 mg because of weight loss and low BP. I offered to send her to the hospital for rate control but she prefers trying to control as outpatient. Will start metoprolol 25 mg bid but take 2 immediately. Return Friday for nurse visit with EKG -No bleeding reported -Continue Xarelto 20 mg    Morbid obesity: -Current BMI is 33 kg/m  down significantly since gastric sleeve surgery    Hypertension: -Blood pressure stable    Hyperlipidemia: -Continue Crestor 5 mg daily -Most recent LDL was 68 on 02/2022              Dispo:  No follow-ups on file.   Medication Adjustments/Labs and Tests Ordered: Current medicines are reviewed at length with the patient today.   Concerns regarding medicines are outlined above.  Tests Ordered: Orders Placed This Encounter  Procedures   Basic Metabolic Panel (BMET)   CBC   TSH   Medication Changes: Meds ordered this encounter  Medications   metoprolol tartrate (LOPRESSOR) 25 MG tablet    Sig: Take 1 tablet (25 mg total) by mouth 2 (two) times daily.    Dispense:  60 tablet    Refill:  3   furosemide (LASIX) 20 MG tablet    Sig: Take 1 tablet (20 mg total) by mouth daily.    Dispense:  30 tablet    Refill:  3   Signed, Jacolyn Reedy, PA-C  11/11/2022 8:41 AM    [facility] [address] [place], [place], [place]  [address] Phone: [phone]; Fax: [phone]

## 2022-11-11 ENCOUNTER — Encounter: Payer: Self-pay | Admitting: Physician Assistant

## 2022-11-11 ENCOUNTER — Ambulatory Visit: Payer: Commercial Managed Care - HMO

## 2022-11-11 ENCOUNTER — Telehealth: Payer: Self-pay | Admitting: Cardiovascular Disease

## 2022-11-11 ENCOUNTER — Ambulatory Visit: Payer: Commercial Managed Care - HMO | Attending: Physician Assistant | Admitting: Physician Assistant

## 2022-11-11 VITALS — BP 124/80 | HR 77 | Ht 65.75 in | Wt 205.0 lb

## 2022-11-11 DIAGNOSIS — I5033 Acute on chronic diastolic (congestive) heart failure: Secondary | ICD-10-CM

## 2022-11-11 DIAGNOSIS — I4891 Unspecified atrial fibrillation: Secondary | ICD-10-CM | POA: Diagnosis not present

## 2022-11-11 DIAGNOSIS — I10 Essential (primary) hypertension: Secondary | ICD-10-CM | POA: Diagnosis not present

## 2022-11-11 DIAGNOSIS — E785 Hyperlipidemia, unspecified: Secondary | ICD-10-CM

## 2022-11-11 MED ORDER — METOPROLOL TARTRATE 25 MG PO TABS: 25.0000 mg | ORAL_TABLET | Freq: Two times a day (BID) | ORAL | 3 refills | Status: DC

## 2022-11-11 MED ORDER — FUROSEMIDE 20 MG PO TABS: 20.0000 mg | ORAL_TABLET | Freq: Every day | ORAL | 3 refills | Status: DC

## 2022-11-11 NOTE — Patient Instructions (Addendum)
Medication Instructions:  START Metoprolol 25mg  take 1 tablet twice a day. WHEN YOU PICK UP YOUR PRESCRIPTION TODAY TAKE 2 TABLETS AT ONE TIME. THEN STARTING TOMORROW TAKE 1 TABLET TWICE A DAY START Lasix 20mg  take one tablet once a day for 3 days then as needed *If you need a refill on your cardiac medications before your next appointment, please call your pharmacy*  Lab Work: TODAY-CBC, BMET, & TSH If you have labs (blood work) drawn today and your tests are completely normal, you will receive your results only by: MyChart Message (if you have MyChart) OR A paper copy in the mail If you have any lab test that is abnormal or we need to change your treatment, we will call you to review the results.  Testing/Procedures: None ordered  Follow-Up: At [facility], you and your health needs are our priority.  As part of our continuing mission to provide you with exceptional heart care, we have created designated Provider Care Teams.  These Care Teams include your primary Cardiologist (physician) and Advanced Practice Providers (APPs -  Physician Assistants and Nurse Practitioners) who all work together to provide you with the care you need, when you need it.  We recommend signing up for the patient portal called "MyChart".  Sign up information is provided on this After Visit Summary.  MyChart is used to connect with patients for Virtual Visits (Telemedicine).  Patients are able to view lab/test results, encounter notes, upcoming appointments, etc.  Non-urgent messages can be sent to your provider as well.   To learn more about what you can do with MyChart, go to .    Your next appointment:   1 week(s)  The format for your next appointment:   In Person  Provider:   [facility], PA-C       Other Instructions   Important Information About Sugar

## 2022-11-11 NOTE — Telephone Encounter (Signed)
Pt states she needs a call back to discuss a note she needs for work for her medication metoprolol tartrate.

## 2022-11-11 NOTE — Addendum Note (Signed)
Addended by: Kandice Robinsons T on: 11/11/2022 09:37 AM   Modules accepted: Orders

## 2022-11-12 LAB — CBC
Hematocrit: 43.6 % (ref 34.0–46.6)
Hemoglobin: 13.9 g/dL (ref 11.1–15.9)
MCH: 28.2 pg (ref 26.6–33.0)
MCHC: 31.9 g/dL (ref 31.5–35.7)
MCV: 88 fL (ref 79–97)
Platelets: 265 10*3/uL (ref 150–450)
RBC: 4.93 x10E6/uL (ref 3.77–5.28)
RDW: 13.8 % (ref 11.7–15.4)
WBC: 6.1 10*3/uL (ref 3.4–10.8)

## 2022-11-12 LAB — BASIC METABOLIC PANEL
BUN/Creatinine Ratio: 18 (ref 9–23)
BUN: 19 mg/dL (ref 6–24)
CO2: 21 mmol/L (ref 20–29)
Calcium: 9.5 mg/dL (ref 8.7–10.2)
Chloride: 106 mmol/L (ref 96–106)
Creatinine, Ser: 1.05 mg/dL — ABNORMAL HIGH (ref 0.57–1.00)
Glucose: 111 mg/dL — ABNORMAL HIGH (ref 70–99)
Potassium: 3.9 mmol/L (ref 3.5–5.2)
Sodium: 142 mmol/L (ref 134–144)
eGFR: 62 mL/min/{1.73_m2} (ref >59)

## 2022-11-12 LAB — TSH: TSH: 2.92 u[IU]/mL (ref 0.450–4.500)

## 2022-11-13 ENCOUNTER — Ambulatory Visit: Payer: Commercial Managed Care - HMO | Attending: Cardiovascular Disease | Admitting: Cardiovascular Disease

## 2022-11-13 VITALS — HR 132 | Wt 205.0 lb

## 2022-11-13 DIAGNOSIS — I480 Paroxysmal atrial fibrillation: Secondary | ICD-10-CM | POA: Diagnosis not present

## 2022-11-13 NOTE — Patient Instructions (Signed)
Medication Instructions:  Your physician recommends that you continue on your current medications as directed. Please refer to the Current Medication list given to you today.  *If you need a refill on your cardiac medications before your next appointment, please call your pharmacy*   Follow-Up: At [facility], you and your health needs are our priority.  As part of our continuing mission to provide you with exceptional heart care, we have created designated Provider Care Teams.  These Care Teams include your primary Cardiologist (physician) and Advanced Practice Providers (APPs -  Physician Assistants and Nurse Practitioners) who all work together to provide you with the care you need, when you need it.  We recommend signing up for the patient portal called "MyChart".  Sign up information is provided on this After Visit Summary.  MyChart is used to connect with patients for Virtual Visits (Telemedicine).  Patients are able to view lab/test results, encounter notes, upcoming appointments, etc.  Non-urgent messages can be sent to your provider as well.   To learn more about what you can do with MyChart, go to [email or web address].    Your next appointment:   Wednesday 11/18/22  The format for your next appointment:   In Person  Provider:   Herma Carson PA      Important Information About Sugar

## 2022-11-13 NOTE — Progress Notes (Signed)
   Nurse Visit   Date of Encounter: 11/13/2022 ID: Audrey Guerra, DOB 11/22/1966, MRN [id number]  PCP:  Gaspar Garbe, MD   [facility] Providers Cardiologist:  Kristeen Miss, MD      Visit Details   VS:  Pulse (!) 132   Wt 205 lb (93 kg)   BMI 33.34 kg/m  , BMI Body mass index is 33.34 kg/m.  Wt Readings from Last 3 Encounters:  11/13/22 205 lb (93 kg)  11/11/22 205 lb (93 kg)  08/25/22 213 lb 4.8 oz (96.8 kg)     Reason for visit: EKG for afib Performed today: EKG, Provider consulted, Education Changes (medications, testing, etc.) : none Length of Visit: 20 minutes  Patient has follow schedule with Herma Carson, PA 11/18/2022   Signed, Eilleen Kempf, RN  11/13/2022 4:12 PM

## 2022-11-16 ENCOUNTER — Telehealth: Payer: Self-pay

## 2022-11-16 DIAGNOSIS — I480 Paroxysmal atrial fibrillation: Secondary | ICD-10-CM

## 2022-11-16 MED ORDER — METOPROLOL TARTRATE 25 MG PO TABS: 37.5000 mg | ORAL_TABLET | Freq: Two times a day (BID) | ORAL | 3 refills | Status: DC

## 2022-11-16 NOTE — Telephone Encounter (Signed)
-----   Message from Dyann Kief, PA-C sent at 11/16/2022  9:32 AM EST ----- EKG reviewed from Friday. Please ask patient to increase her metoprolol 37.5 mg bid. If her heart rate is still going fast-please ask her, see if we can get her into Afib clinic tomorrow or next day. She has an appt with me 12/6 but she should probably be seen in afib clinic if possible.

## 2022-11-16 NOTE — Progress Notes (Deleted)
Cardiology Office Note:    Date:  11/16/2022   ID:  LEONARDO PLAIA, DOB [birth year], MRN [id number]  PCP:  Gaspar Garbe, MD  [facility] Providers Cardiologist:  Kristeen Miss, MD { Click to update primary MD,subspecialty MD or APP then REFRESH:1}    Referring MD: Tisovec, Adelfa Koh, MD   Chief Complaint:  No chief complaint on file. {Click here for Visit Info    :1}    History of Present Illness:   Audrey Guerra is a 56 y.o. female with a hx of morbid obesity, HTN,  history of a cardiomyopathy, gastric sleeve surgery, HTN, HLD  .   Echocardiogram from July 12, 2020 done at [facility] reveals normal left ventricular systolic function with ejection fraction of 55 to 60%. She has grade 1 diastolic dysfunction. No significant valvular abnormalities.    Dr. Kennon Rounds office ( [facility])  An event monitor showed PAF-She was started on Xarelto and metoprolol following that event monitor    Patient had bariatric surgery 06/30/22 and hadn't been eating or drinking well. Metoprolol, valsartan and HCTZ stopped. Weight was down 40 lbs to 222 lbs.    Patient saw Dr. Elease Hashimoto 07/14/22 and doing well. No changes made.  I saw patient 11/11/22 with worsening SOB and weight gain. She was in Afib with RVR.  Was off metoprolol since recent weigh loss which I restarted. I also started lasix 20 mg for 3 days. She came into the office for f/u EKG and Afib 164/m but feeling better. I told her to increase metoprolol 37.5 mg bid      Past Medical History:  Diagnosis Date   Atrial fibrillation ([facility])    BMI 40.0-44.9, adult ([facility])    Cardiomegaly    Hyperlipidemia    Hypertension    Morbid obesity ([facility])    Osteoarthritis    PTSD (post-traumatic stress disorder)    Current Medications: No outpatient medications have been marked as taking for the 11/18/22 encounter (Appointment) with Dyann Kief, PA-C.    Allergies:   Patient has no known allergies.   Social  History   Tobacco Use   Smoking status: Former   Smokeless tobacco: Never  Building services engineer Use: Never used  Substance Use Topics   Alcohol use: Not Currently    Comment: social   Drug use: No    Family Hx: The patient's family history includes Diabetes in her father and mother.  ROS     Physical Exam:    VS:  There were no vitals taken for this visit.    Wt Readings from Last 3 Encounters:  11/13/22 205 lb (93 kg)  11/11/22 205 lb (93 kg)  08/25/22 213 lb 4.8 oz (96.8 kg)    Physical Exam  GEN: Well nourished, well developed, in no acute distress  HEENT: normal  Neck: no JVD, carotid bruits, or masses Cardiac:RRR; no murmurs, rubs, or gallops  Respiratory:  clear to auscultation bilaterally, normal work of breathing GI: soft, nontender, nondistended, + BS Ext: without cyanosis, clubbing, or edema, Good distal pulses bilaterally MS: no deformity or atrophy  Skin: warm and dry, no rash Neuro:  Alert and Oriented x 3, Strength and sensation are intact Psych: euthymic mood, full affect        EKGs/Labs/Other Test Reviewed:    EKG:  EKG is *** ordered today.  The ekg ordered today demonstrates ***  Recent Labs: 06/23/2022: ALT 16 07/06/2022: B  Natriuretic Peptide 114.1 07/07/2022: Magnesium 1.8 11/11/2022: BUN 19; Creatinine, Ser 1.05; Hemoglobin 13.9; Platelets 265; Potassium 3.9; Sodium 142; TSH 2.920   Recent Lipid Panel No results for input(s): "CHOL", "TRIG", "HDL", "VLDL", "LDLCALC", "LDLDIRECT" in the last 8760 hours.   Prior CV Studies: {Select studies to display:26339}  ***   Risk Assessment/Calculations/Metrics:   {Does this patient have ATRIAL FIBRILLATION?:[phone]}     No BP recorded.  {Refresh Note OR Click here to enter BP  :1}***    ASSESSMENT & PLAN:   No problem-specific Assessment & Plan notes found for this encounter.           {Are you ordering a CV Procedure (e.g. stress test, cath, DCCV, TEE, etc)?   Press F2         :[id number]   Dispo:  No follow-ups on file.   Medication Adjustments/Labs and Tests Ordered: Current medicines are reviewed at length with the patient today.  Concerns regarding medicines are outlined above.  Tests Ordered: No orders of the defined types were placed in this encounter.  Medication Changes: No orders of the defined types were placed in this encounter.  Signed, Ermalinda Barrios, PA-C  11/16/2022 10:01 AM    [place], [place], [place]  [address] Phone: [phone]; Fax: [phone]

## 2022-11-16 NOTE — Addendum Note (Signed)
Addended by: Vernard Gambles on: 11/16/2022 01:13 PM   Modules accepted: Orders

## 2022-11-16 NOTE — Telephone Encounter (Signed)
Returned call to patient who has already spoken with Clarene Duke, CMA today regarding increase in dose of metoprolol. Medication has been sent to pharmacy on file and pt is scheduled to be seen in A-fib clinic tomorrow at 3:30 per Eileen Stanford, RN. Address provided, no further questions.

## 2022-11-17 ENCOUNTER — Ambulatory Visit (HOSPITAL_COMMUNITY)
Admission: RE | Admit: 2022-11-17 | Discharge: 2022-11-17 | Disposition: A | Discharge status: Home / Self Care | Payer: Commercial Managed Care - HMO | Source: Ambulatory Visit | Attending: Nurse Practitioner | Admitting: Nurse Practitioner

## 2022-11-17 VITALS — BP 114/86 | HR 112 | Ht 65.75 in | Wt 203.8 lb

## 2022-11-17 DIAGNOSIS — I480 Paroxysmal atrial fibrillation: Secondary | ICD-10-CM | POA: Diagnosis present

## 2022-11-17 DIAGNOSIS — E785 Hyperlipidemia, unspecified: Secondary | ICD-10-CM | POA: Diagnosis not present

## 2022-11-17 DIAGNOSIS — D6869 Other thrombophilia: Secondary | ICD-10-CM

## 2022-11-17 DIAGNOSIS — I10 Essential (primary) hypertension: Secondary | ICD-10-CM | POA: Diagnosis not present

## 2022-11-17 DIAGNOSIS — Z7901 Long term (current) use of anticoagulants: Secondary | ICD-10-CM | POA: Insufficient documentation

## 2022-11-17 DIAGNOSIS — I4891 Unspecified atrial fibrillation: Secondary | ICD-10-CM | POA: Diagnosis not present

## 2022-11-17 DIAGNOSIS — Z79899 Other long term (current) drug therapy: Secondary | ICD-10-CM | POA: Insufficient documentation

## 2022-11-17 NOTE — Patient Instructions (Addendum)
Cardioversion scheduled for: Tuesday, December 19th Come to afib clinic for labs at 11am   - Arrive at the [facility] and go to admitting at [facility]   - Do not eat or drink anything after midnight the night prior to your procedure.   - Take all your morning medication (except diabetic medications) with a sip of water prior to arrival. - You will not be able to drive home after your procedure.    - Do NOT miss any doses of your blood thinner - if you should miss a dose please notify our office immediately.   - If you feel as if you go back into normal rhythm prior to scheduled cardioversion, please notify our office immediately.  If your procedure is canceled in the cardioversion suite you will be charged a cancellation fee.   If you are on weekly OZEMPIC, TRULICITY, MOUNJARO, OR BYDUREON  Hold medication 7 days prior to scheduled procedure/anesthesia.  Restart medication on the normal dosing day after scheduled procedure/anesthesia  If you are on daily BYETTA, WEGOVY, VICTOZA, ADLYXIN, OR RYBELSUS:   Hold medication 24 hours prior to scheduled procedure/anesthesia.   Restart medication on the following day after scheduled procedure/anesthesia   For those patients who have a scheduled procedure/anesthesia on the same day of the week as their dose, hold the medication on the day of surgery.  They can take their scheduled dose the week before.  **Patients on the above medications scheduled for elective procedures that have not held the medication for the appropriate amount of time are at risk of cancellation or change in the anesthetic plan.

## 2022-11-18 ENCOUNTER — Encounter (HOSPITAL_COMMUNITY): Payer: Self-pay | Admitting: Nurse Practitioner

## 2022-11-18 ENCOUNTER — Ambulatory Visit: Payer: Commercial Managed Care - HMO | Admitting: Physician Assistant

## 2022-11-18 NOTE — Progress Notes (Signed)
Primary Care Physician: Haywood Pao, MD Referring Physician: Ermalinda Barrios, PA   Audrey Guerra is a 56 y.o. female with a h/o morbid obesity, HTN,  history of a cardiomyopathy, gastric sleeve surgery, HTN, HLD  .   Echocardiogram from July 12, 2020 done at [facility] reveals normal left ventricular systolic function with ejection fraction of 55 to 60%. She has grade 1 diastolic dysfunction. No significant valvular abnormalities. An early event monitor showed PAF and was started on xarelto and metoprolol.  She had bariatric surgery 06/30/22 and has not been eating or drinking well with metoprolol [name] and HCTZ stopped. She has lost 60+ lbs since surgery.   She was recently seen by Ermalinda Barrios and was found to be in afib with RVR. Metoprolol was restarted and referred to the afib clinic.  Today, she remains in afib with HR of 112 bpm, she has been taking a lower dose of BB in the am by mistake and will start with correct dose of  37.5 mg bid today. She states she thinks the trigger was having a change in job descriptions and very erratic hours. She has since left that job. She continues on xarelto 20 mg daily for a CHA2DS2VASc  score of 2.   Today, she denies symptoms of palpitations, chest pain, shortness of breath, orthopnea, PND, lower extremity edema, dizziness, presyncope, syncope, or neurologic sequela. The patient is tolerating medications without difficulties and is otherwise without complaint today.   Past Medical History:  Diagnosis Date   Atrial fibrillation ([place])    BMI 40.0-44.9, adult ([place])    Cardiomegaly    Hyperlipidemia    Hypertension    Morbid obesity ([place])    Osteoarthritis    PTSD (post-traumatic stress disorder)    Past Surgical History:  Procedure Laterality Date   CESAREAN SECTION     2 previous c-sections   LAPAROSCOPIC GASTRIC SLEEVE RESECTION N/A 06/30/2022   Procedure: LAPAROSCOPIC SLEEVE GASTRECTOMY;  Surgeon: Mickeal Skinner, MD;   Location: [facility];  Service: General;  Laterality: N/A;   UPPER GI ENDOSCOPY N/A 06/30/2022   Procedure: UPPER GI ENDOSCOPY;  Surgeon: Mickeal Skinner, MD;  Location: [facility];  Service: General;  Laterality: N/A;    Current Outpatient Medications  Medication Sig Dispense Refill   furosemide (LASIX) 20 MG tablet Take 1 tablet (20 mg total) by mouth daily. 30 tablet 3   hydroxypropyl methylcellulose / hypromellose (ISOPTO TEARS / GONIOVISC) 2.5 % ophthalmic solution Place 1 drop into both eyes as needed for dry eyes.     Menthol, Topical Analgesic, (ICY HOT) 7.5 % (Roll) MISC Apply 1 each topically daily as needed (pain).     metoprolol tartrate (LOPRESSOR) 25 MG tablet Take 1.5 tablets (37.5 mg total) by mouth 2 (two) times daily. 180 tablet 3   pantoprazole (PROTONIX) 40 MG tablet Take 1 tablet (40 mg total) by mouth daily. 90 tablet 0   rivaroxaban (XARELTO) 20 MG TABS tablet Take 1 tablet (20 mg total) by mouth daily. 90 tablet 3   rosuvastatin (CRESTOR) 5 MG tablet Take 5 mg by mouth daily.     ondansetron (ZOFRAN-ODT) 4 MG disintegrating tablet Dissolve 1 tablet (4 mg total) by mouth every 6 (six) hours as needed for nausea or vomiting. (Patient not taking: Reported on 11/17/2022) 20 tablet 0   No current facility-administered medications for this encounter.    No Known Allergies  Social History   Socioeconomic History   Marital status: Single  Spouse name: Not on file   Number of children: Not on file   Years of education: Not on file   Highest education level: Not on file  Occupational History   Not on file  Tobacco Use   Smoking status: Former   Smokeless tobacco: Never  Vaping Use   Vaping Use: Never used  Substance and Sexual Activity   Alcohol use: Not Currently    Comment: social   Drug use: No   Sexual activity: Yes    Birth control/protection: Condom  Other Topics Concern   Not on file  Social History Narrative   Not on file   Social Determinants of  Health   Financial Resource Strain: Not on file  Food Insecurity: Not on file  Transportation Needs: Not on file  Physical Activity: Not on file  Stress: Not on file  Social Connections: Not on file  Intimate Partner Violence: Not on file    Family History  Problem Relation Age of Onset   Diabetes Mother    Diabetes Father     ROS- All systems are reviewed and negative except as per the HPI above  Physical Exam: Vitals:   11/17/22 1522  BP: 114/86  Pulse: (!) 112  Weight: 92.4 kg  Height: 5' 5.75" (1.67 m)   Wt Readings from Last 3 Encounters:  11/17/22 92.4 kg  11/13/22 93 kg  11/11/22 93 kg    Labs: Lab Results  Component Value Date   NA 142 11/11/2022   K 3.9 11/11/2022   CL 106 11/11/2022   CO2 21 11/11/2022   GLUCOSE 111 (H) 11/11/2022   BUN 19 11/11/2022   CREATININE 1.05 (H) 11/11/2022   CALCIUM 9.5 11/11/2022   MG 1.8 07/07/2022   No results found for: "INR" No results found for: "CHOL", "HDL", "[place]", "TRIG"   GEN- The patient is well appearing, alert and oriented x 3 today.   Head- normocephalic, atraumatic Eyes-  Sclera clear, conjunctiva pink Ears- hearing intact Oropharynx- clear Neck- supple, no JVP Lymph- no cervical lymphadenopathy Lungs- Clear to ausculation bilaterally, normal work of breathing Heart- Regular rate and rhythm, no murmurs, rubs or gallops, PMI not laterally displaced GI- soft, NT, ND, + BS Extremities- no clubbing, cyanosis, or edema MS- no significant deformity or atrophy Skin- no rash or lesion Psych- euthymic mood, full affect Neuro- strength and sensation are intact  EKG-afib at 112 bpm, qrs int 70 ms, qtc  387 ms   EPIC records reviewed  Echo - 1. Left ventricular ejection fraction, by estimation, is 55 to 60%. The  left ventricle has normal function. The left ventricle has no regional  wall motion abnormalities. Left ventricular diastolic parameters were  normal.   2. Right ventricular systolic  function is normal. The right ventricular  size is normal. Tricuspid regurgitation signal is inadequate for assessing  PA pressure.   3. The mitral valve is normal in structure. No evidence of mitral valve  regurgitation.   4. The aortic valve is tricuspid. Aortic valve regurgitation is not  visualized.   5. The inferior vena cava is normal in size with greater than 50%  respiratory variability, suggesting right atrial pressure of 3 mmHg.   Comparison(s): No prior Echocardiogram.   Conclusion(s)/Recommendation(s): Normal biventricular function without  evidence of hemodynamically significant valvular heart disease.    Assessment and Plan:  1. PAF Pt has been in afib for unknown period of time General education re afib and triggers discussed  Increase metoprolol to  37.5 mg bid  No caffeine or tobacco Discussed proceeding with cardioversion, risk vrs benefit,  and she is in agreement   2. CHA2DS2VASc  score of 2 Continue xarelto 20 mg daily  States no missed doses for at least 3 weeks  3. HTN Stable   F/u in afib clinic one week after cardioveson

## 2022-11-18 NOTE — H&P (View-Only) (Signed)
Primary Care Physician: Haywood Pao, MD Referring Physician: Ermalinda Barrios, PA   Audrey Guerra is a 56 y.o. female with a h/o morbid obesity, HTN,  history of a cardiomyopathy, gastric sleeve surgery, HTN, HLD  .   Echocardiogram from July 12, 2020 done at [facility] reveals normal left ventricular systolic function with ejection fraction of 55 to 60%. She has grade 1 diastolic dysfunction. No significant valvular abnormalities. An early event monitor showed PAF and was started on xarelto and metoprolol.  She had bariatric surgery 06/30/22 and has not been eating or drinking well with metoprolol [name] and HCTZ stopped. She has lost 60+ lbs since surgery.   She was recently seen by Ermalinda Barrios and was found to be in afib with RVR. Metoprolol was restarted and referred to the afib clinic.  Today, she remains in afib with HR of 112 bpm, she has been taking a lower dose of BB in the am by mistake and will start with correct dose of  37.5 mg bid today. She states she thinks the trigger was having a change in job descriptions and very erratic hours. She has since left that job. She continues on xarelto 20 mg daily for a CHA2DS2VASc  score of 2.   Today, she denies symptoms of palpitations, chest pain, shortness of breath, orthopnea, PND, lower extremity edema, dizziness, presyncope, syncope, or neurologic sequela. The patient is tolerating medications without difficulties and is otherwise without complaint today.   Past Medical History:  Diagnosis Date   Atrial fibrillation ([place])    BMI 40.0-44.9, adult ([place])    Cardiomegaly    Hyperlipidemia    Hypertension    Morbid obesity ([place])    Osteoarthritis    PTSD (post-traumatic stress disorder)    Past Surgical History:  Procedure Laterality Date   CESAREAN SECTION     2 previous c-sections   LAPAROSCOPIC GASTRIC SLEEVE RESECTION N/A 06/30/2022   Procedure: LAPAROSCOPIC SLEEVE GASTRECTOMY;  Surgeon: Mickeal Skinner, MD;   Location: [facility];  Service: General;  Laterality: N/A;   UPPER GI ENDOSCOPY N/A 06/30/2022   Procedure: UPPER GI ENDOSCOPY;  Surgeon: Mickeal Skinner, MD;  Location: [facility];  Service: General;  Laterality: N/A;    Current Outpatient Medications  Medication Sig Dispense Refill   furosemide (LASIX) 20 MG tablet Take 1 tablet (20 mg total) by mouth daily. 30 tablet 3   hydroxypropyl methylcellulose / hypromellose (ISOPTO TEARS / GONIOVISC) 2.5 % ophthalmic solution Place 1 drop into both eyes as needed for dry eyes.     Menthol, Topical Analgesic, (ICY HOT) 7.5 % (Roll) MISC Apply 1 each topically daily as needed (pain).     metoprolol tartrate (LOPRESSOR) 25 MG tablet Take 1.5 tablets (37.5 mg total) by mouth 2 (two) times daily. 180 tablet 3   pantoprazole (PROTONIX) 40 MG tablet Take 1 tablet (40 mg total) by mouth daily. 90 tablet 0   rivaroxaban (XARELTO) 20 MG TABS tablet Take 1 tablet (20 mg total) by mouth daily. 90 tablet 3   rosuvastatin (CRESTOR) 5 MG tablet Take 5 mg by mouth daily.     ondansetron (ZOFRAN-ODT) 4 MG disintegrating tablet Dissolve 1 tablet (4 mg total) by mouth every 6 (six) hours as needed for nausea or vomiting. (Patient not taking: Reported on 11/17/2022) 20 tablet 0   No current facility-administered medications for this encounter.    No Known Allergies  Social History   Socioeconomic History   Marital status: Single  Spouse name: Not on file   Number of children: Not on file   Years of education: Not on file   Highest education level: Not on file  Occupational History   Not on file  Tobacco Use   Smoking status: Former   Smokeless tobacco: Never  Vaping Use   Vaping Use: Never used  Substance and Sexual Activity   Alcohol use: Not Currently    Comment: social   Drug use: No   Sexual activity: Yes    Birth control/protection: Condom  Other Topics Concern   Not on file  Social History Narrative   Not on file   Social Determinants of  Health   Financial Resource Strain: Not on file  Food Insecurity: Not on file  Transportation Needs: Not on file  Physical Activity: Not on file  Stress: Not on file  Social Connections: Not on file  Intimate Partner Violence: Not on file    Family History  Problem Relation Age of Onset   Diabetes Mother    Diabetes Father     ROS- All systems are reviewed and negative except as per the HPI above  Physical Exam: Vitals:   11/17/22 1522  BP: 114/86  Pulse: (!) 112  Weight: 92.4 kg  Height: 5' 5.75" (1.67 m)   Wt Readings from Last 3 Encounters:  11/17/22 92.4 kg  11/13/22 93 kg  11/11/22 93 kg    Labs: Lab Results  Component Value Date   NA 142 11/11/2022   K 3.9 11/11/2022   CL 106 11/11/2022   CO2 21 11/11/2022   GLUCOSE 111 (H) 11/11/2022   BUN 19 11/11/2022   CREATININE 1.05 (H) 11/11/2022   CALCIUM 9.5 11/11/2022   MG 1.8 07/07/2022   No results found for: "INR" No results found for: "CHOL", "HDL", "[place]", "TRIG"   GEN- The patient is well appearing, alert and oriented x 3 today.   Head- normocephalic, atraumatic Eyes-  Sclera clear, conjunctiva pink Ears- hearing intact Oropharynx- clear Neck- supple, no JVP Lymph- no cervical lymphadenopathy Lungs- Clear to ausculation bilaterally, normal work of breathing Heart- Regular rate and rhythm, no murmurs, rubs or gallops, PMI not laterally displaced GI- soft, NT, ND, + BS Extremities- no clubbing, cyanosis, or edema MS- no significant deformity or atrophy Skin- no rash or lesion Psych- euthymic mood, full affect Neuro- strength and sensation are intact  EKG-afib at 112 bpm, qrs int 70 ms, qtc  387 ms   EPIC records reviewed  Echo - 1. Left ventricular ejection fraction, by estimation, is 55 to 60%. The  left ventricle has normal function. The left ventricle has no regional  wall motion abnormalities. Left ventricular diastolic parameters were  normal.   2. Right ventricular systolic  function is normal. The right ventricular  size is normal. Tricuspid regurgitation signal is inadequate for assessing  PA pressure.   3. The mitral valve is normal in structure. No evidence of mitral valve  regurgitation.   4. The aortic valve is tricuspid. Aortic valve regurgitation is not  visualized.   5. The inferior vena cava is normal in size with greater than 50%  respiratory variability, suggesting right atrial pressure of 3 mmHg.   Comparison(s): No prior Echocardiogram.   Conclusion(s)/Recommendation(s): Normal biventricular function without  evidence of hemodynamically significant valvular heart disease.    Assessment and Plan:  1. PAF Pt has been in afib for unknown period of time General education re afib and triggers discussed  Increase metoprolol to  37.5 mg bid  No caffeine or tobacco Discussed proceeding with cardioversion, risk vrs benefit,  and she is in agreement   2. CHA2DS2VASc  score of 2 Continue xarelto 20 mg daily  States no missed doses for at least 3 weeks  3. HTN Stable   F/u in afib clinic one week after cardioveson

## 2022-11-25 ENCOUNTER — Ambulatory Visit (HOSPITAL_COMMUNITY)
Admission: RE | Admit: 2022-11-25 | Discharge: 2022-11-25 | Disposition: A | Discharge status: Home / Self Care | Payer: Commercial Managed Care - HMO | Source: Ambulatory Visit | Attending: Nurse Practitioner | Admitting: Nurse Practitioner

## 2022-11-25 VITALS — HR 96

## 2022-11-25 DIAGNOSIS — I4891 Unspecified atrial fibrillation: Secondary | ICD-10-CM | POA: Diagnosis not present

## 2022-11-25 NOTE — Progress Notes (Addendum)
Pt in for EKG today as she felt like she had gone back in rhythm. Her EKG still shows atrial flutter at 96 bpm. Cardioversion as planned for 12/19.

## 2022-12-01 ENCOUNTER — Ambulatory Visit (HOSPITAL_COMMUNITY)
Admission: RE | Admit: 2022-12-01 | Discharge: 2022-12-01 | Disposition: A | Discharge status: Home / Self Care | Payer: Commercial Managed Care - HMO | Source: Ambulatory Visit | Attending: Physician Assistant | Admitting: Physician Assistant

## 2022-12-01 ENCOUNTER — Ambulatory Visit (HOSPITAL_COMMUNITY)
Admission: RE | Admit: 2022-12-01 | Discharge: 2022-12-01 | Disposition: A | Discharge status: Home / Self Care | Payer: Commercial Managed Care - HMO | Attending: Cardiology | Admitting: Cardiology

## 2022-12-01 ENCOUNTER — Encounter (HOSPITAL_COMMUNITY)
Admission: RE | Disposition: A | Discharge status: Home / Self Care | Payer: Self-pay | Source: Home / Self Care | Attending: Cardiology

## 2022-12-01 ENCOUNTER — Encounter (HOSPITAL_COMMUNITY): Payer: Self-pay | Admitting: Cardiology

## 2022-12-01 ENCOUNTER — Ambulatory Visit (HOSPITAL_BASED_OUTPATIENT_CLINIC_OR_DEPARTMENT_OTHER): Payer: Commercial Managed Care - HMO | Admitting: Anesthesiology

## 2022-12-01 ENCOUNTER — Other Ambulatory Visit: Payer: Self-pay

## 2022-12-01 ENCOUNTER — Ambulatory Visit (HOSPITAL_COMMUNITY): Payer: Commercial Managed Care - HMO | Admitting: Anesthesiology

## 2022-12-01 DIAGNOSIS — I110 Hypertensive heart disease with heart failure: Secondary | ICD-10-CM

## 2022-12-01 DIAGNOSIS — Z7901 Long term (current) use of anticoagulants: Secondary | ICD-10-CM | POA: Insufficient documentation

## 2022-12-01 DIAGNOSIS — Z87891 Personal history of nicotine dependence: Secondary | ICD-10-CM | POA: Insufficient documentation

## 2022-12-01 DIAGNOSIS — I4891 Unspecified atrial fibrillation: Secondary | ICD-10-CM

## 2022-12-01 DIAGNOSIS — K219 Gastro-esophageal reflux disease without esophagitis: Secondary | ICD-10-CM | POA: Diagnosis not present

## 2022-12-01 DIAGNOSIS — I509 Heart failure, unspecified: Secondary | ICD-10-CM | POA: Diagnosis not present

## 2022-12-01 DIAGNOSIS — Z9884 Bariatric surgery status: Secondary | ICD-10-CM | POA: Diagnosis not present

## 2022-12-01 DIAGNOSIS — I480 Paroxysmal atrial fibrillation: Secondary | ICD-10-CM | POA: Insufficient documentation

## 2022-12-01 DIAGNOSIS — E785 Hyperlipidemia, unspecified: Secondary | ICD-10-CM | POA: Insufficient documentation

## 2022-12-01 DIAGNOSIS — F419 Anxiety disorder, unspecified: Secondary | ICD-10-CM

## 2022-12-01 HISTORY — PX: CARDIOVERSION: SHX1299

## 2022-12-01 LAB — CBC
HCT: 46.2 % — ABNORMAL HIGH (ref 36.0–46.0)
Hemoglobin: 14.9 g/dL (ref 12.0–15.0)
MCH: 28.9 pg (ref 26.0–34.0)
MCHC: 32.3 g/dL (ref 30.0–36.0)
MCV: 89.5 fL (ref 80.0–100.0)
Platelets: 249 10*3/uL (ref 150–400)
RBC: 5.16 MIL/uL — ABNORMAL HIGH (ref 3.87–5.11)
RDW: 15.3 % (ref 11.5–15.5)
WBC: 7.3 10*3/uL (ref 4.0–10.5)
nRBC: 0 % (ref 0.0–0.2)

## 2022-12-01 LAB — BASIC METABOLIC PANEL
Anion gap: 11 (ref 5–15)
BUN: 12 mg/dL (ref 6–20)
CO2: 25 mmol/L (ref 22–32)
Calcium: 9.1 mg/dL (ref 8.9–10.3)
Chloride: 107 mmol/L (ref 98–111)
Creatinine, Ser: 1.07 mg/dL — ABNORMAL HIGH (ref 0.44–1.00)
GFR, Estimated: >60 mL/min (ref >60)
Glucose, Bld: 110 mg/dL — ABNORMAL HIGH (ref 70–99)
Potassium: 3.4 mmol/L — ABNORMAL LOW (ref 3.5–5.1)
Sodium: 143 mmol/L (ref 135–145)

## 2022-12-01 SURGERY — CARDIOVERSION
Anesthesia: General

## 2022-12-01 MED ORDER — PROPOFOL 10 MG/ML IV BOLUS
INTRAVENOUS | Status: DC | PRN
  Administered 2022-12-01: 70 mg via INTRAVENOUS

## 2022-12-01 MED ORDER — SODIUM CHLORIDE 0.9 % IV SOLN: INTRAVENOUS | Status: DC

## 2022-12-01 MED ORDER — LIDOCAINE HCL (CARDIAC) PF 100 MG/5ML IV SOSY
PREFILLED_SYRINGE | INTRAVENOUS | Status: DC | PRN
  Administered 2022-12-01: 50 mg via INTRAVENOUS

## 2022-12-01 NOTE — Anesthesia Postprocedure Evaluation (Signed)
Anesthesia Post Note  Patient: Audrey Guerra  Procedure(s) Performed: CARDIOVERSION     Patient location during evaluation: PACU Anesthesia Type: General Level of consciousness: awake and alert and oriented Pain management: pain level controlled Vital Signs Assessment: post-procedure vital signs reviewed and stable Respiratory status: spontaneous breathing, nonlabored ventilation and respiratory function stable Cardiovascular status: blood pressure returned to baseline and stable Postop Assessment: no apparent nausea or vomiting Anesthetic complications: no   No notable events documented.  Last Vitals:  Vitals:   12/01/22 1231 12/01/22 1239  BP: 104/89 126/87  Pulse: 92 83  Resp: (!) 31 (!) 21  Temp:    SpO2: 92% 95%    Last Pain:  Vitals:   12/01/22 1239  TempSrc:   PainSc: 0-No pain                 Sulamita Lafountain A.

## 2022-12-01 NOTE — Anesthesia Preprocedure Evaluation (Addendum)
Anesthesia Evaluation  Patient identified by MRN, date of birth, ID band Patient awake    Reviewed: Allergy & Precautions, NPO status , Patient's Chart, lab work & pertinent test results, reviewed documented beta blocker date and time   Airway Mallampati: II  TM Distance: >3 FB Neck ROM: Full    Dental no notable dental hx. (+) Dental Advisory Given   Pulmonary former smoker   Pulmonary exam normal breath sounds clear to auscultation       Cardiovascular hypertension, Pt. on medications and Pt. on home beta blockers +CHF  + dysrhythmias Atrial Fibrillation  Rhythm:Irregular Rate:Normal     Neuro/Psych  PSYCHIATRIC DISORDERS Anxiety     negative neurological ROS     GI/Hepatic Neg liver ROS,GERD  ,,  Endo/Other  Obesity Hyperlipidemia  Renal/GU negative Renal ROS  negative genitourinary   Musculoskeletal  (+) Arthritis , Osteoarthritis,    Abdominal   Peds  Hematology Xarelto therapy- last dose yesterday pm   Anesthesia Other Findings   Reproductive/Obstetrics                             Anesthesia Physical Anesthesia Plan  ASA: 3  Anesthesia Plan:    Post-op Pain Management: Minimal or no pain anticipated   Induction: Intravenous  PONV Risk Score and Plan: 2 and Treatment may vary due to age or medical condition and Propofol infusion  Airway Management Planned: Natural Airway, Nasal Cannula and Mask  Additional Equipment: None  Intra-op Plan:   Post-operative Plan:   Informed Consent: I have reviewed the patients History and Physical, chart, labs and discussed the procedure including the risks, benefits and alternatives for the proposed anesthesia with the patient or authorized representative who has indicated his/her understanding and acceptance.     Dental advisory given  Plan Discussed with: Anesthesiologist and CRNA  Anesthesia Plan Comments:          Anesthesia Quick Evaluation

## 2022-12-01 NOTE — Transfer of Care (Signed)
Immediate Anesthesia Transfer of Care Note  Patient: Audrey Guerra  Procedure(s) Performed: CARDIOVERSION  Patient Location: PACU  Anesthesia Type:General  Level of Consciousness: drowsy  Airway & Oxygen Therapy: Patient Spontanous Breathing and Patient connected to nasal cannula oxygen  Post-op Assessment: Report given to RN and Post -op Vital signs reviewed and stable  Post vital signs: Reviewed and stable  Last Vitals:  Vitals Value Taken Time  BP    Temp    Pulse    Resp    SpO2      Last Pain:  Vitals:   12/01/22 1057  TempSrc: Tympanic  PainSc: 0-No pain         Complications: No notable events documented.

## 2022-12-01 NOTE — Discharge Instructions (Signed)

## 2022-12-01 NOTE — Interval H&P Note (Signed)
History and Physical Interval Note:  12/01/2022 11:52 AM  Audrey Guerra  has presented today for surgery, with the diagnosis of AFIB.  The various methods of treatment have been discussed with the patient and family. After consideration of risks, benefits and other options for treatment, the patient has consented to  Procedure(s): CARDIOVERSION (N/A) as a surgical intervention.  The patient's history has been reviewed, patient examined, no change in status, stable for surgery.  I have reviewed the patient's chart and labs.  Questions were answered to the patient's satisfaction.     Zoltan Genest Audrey Guerra

## 2022-12-01 NOTE — CV Procedure (Signed)
Procedure:   DCCV  Indication:  Symptomatic atrial fibrillation  Procedure Note:  The patient signed informed consent.  They have had had therapeutic anticoagulation with rivaroxaban greater than 3 weeks.  Anesthesia was administered by Dr. Malen Gauze.  Patient received 50 mg IV lidocaine and 70 mg IV propofol.Adequate airway was maintained throughout and vital followed per protocol.  They were cardioverted x 1 with 120J of biphasic synchronized energy.  They converted to sinus bradycardia, but after about 30 seconds reverted back to atrial fibrillation. They were shocked again with 150 J of biphasic synchronized energy. She again converted but in less than a minute was back in atrial fibrillation.  There were no apparent complications.  The patient had normal neuro status and respiratory status post procedure with vitals stable as recorded elsewhere.    Follow up:  They will continue on current medical therapy and follow up with cardiology as scheduled.  Jodelle Red, MD PhD 12/01/2022 12:10 PM

## 2022-12-04 ENCOUNTER — Encounter: Payer: Self-pay | Admitting: Dietician

## 2022-12-04 ENCOUNTER — Encounter: Payer: Commercial Managed Care - HMO | Attending: General Surgery | Admitting: Dietician

## 2022-12-04 VITALS — Ht 64.0 in | Wt 195.9 lb

## 2022-12-04 DIAGNOSIS — E669 Obesity, unspecified: Secondary | ICD-10-CM | POA: Diagnosis present

## 2022-12-04 NOTE — Progress Notes (Signed)
Bariatric Nutrition Follow-Up Visit Medical Nutrition Therapy   NUTRITION ASSESSMENT   Surgery date: 06/30/2022 Surgery type: RYGB  Anthropometrics  Start weight at [facility]: 262.4 lbs (date: 07/14/2021)  Height: 64 in Weight today: 195.9 lbs  Clinical  Medical hx: anxiety Medications: see list  Labs: none in EMR Notable signs/symptoms: none Any previous deficiencies? 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  Pt states she goes into a-fib, stating her heart doctor has advised her on her physical activity, and she tries to get the activity in 15 minute intervals/sessions. Pt agreeable to switching to whole grain bread, stating she likes that kind of bread and ate it before surgery. Pt states she likes apples with peanut butter, stating she takes off the skin of the apple.  Estimated daily fluid intake: 64 oz Estimated daily protein intake: 60 g Supplements: multi and calcium Current average weekly physical activity: Two days a week on weekends, treadmill 30 minutes in the evening  24-Hr Dietary Recall First Meal: 8:30-9: protein shake or grits, eggs, sausage patty Snack:  apple with peanut Second Meal: shrimp and broccoli and cauliflower or sandwich with raisin bread Snack:  peanuts  Third Meal: shrimp and broccoli and cauliflower Snack:  Beverages: water  Post-Op Goals/  Signs/ Symptoms Using straws: no Drinking while eating: no Chewing/swallowing difficulties: no Changes in vision: no Changes to mood/headaches: no Hair loss/changes to skin/nails: no Difficulty focusing/concentrating: no Sweating: no Limb weakness: no Dizziness/lightheadedness: no Palpitations: no  Carbonated/caffeinated beverages: no N/V/D/C/Gas: no Abdominal pain: no Dumping syndrome: no    NUTRITION DIAGNOSIS  Overweight/obesity ([place]-3.3) related to past poor dietary habits and physical inactivity as evidenced by completed bariatric surgery and following dietary guidelines for continued weight loss and healthy nutrition status.     NUTRITION INTERVENTION Nutrition counseling (C-1) and education (E-2) to facilitate bariatric surgery goals, including: The importance of consuming adequate calories as well as certain nutrients daily due to the body's need for essential vitamins, minerals, and fats The importance of daily physical activity and to reach a goal of at least 150 minutes of moderate to vigorous physical activity weekly (or as directed by their physician) due to benefits such as increased musculature and improved lab values The importance of intuitive eating specifically learning hunger-satiety cues and understanding the importance of learning a new body: The importance of mindful eating to avoid grazing behaviors  Encouraged patient to honor their body's internal hunger and fullness cues.  Throughout the day, check in mentally and rate hunger. Stop eating when satisfied not full regardless of  how much food is left on the plate.  Get more if still hungry 20-30 minutes later.  The key is to honor satisfaction so throughout the meal, rate fullness factor and stop when comfortably satisfied not physically full. The key is to honor hunger and fullness without any feelings of guilt or shame.  Pay attention to what the internal cues are, rather than any external factors. This will  enhance the confidence you have in listening to your own body and following those internal cues enabling you to increase how often you eat when you are hungry not out of appetite and stop when you are satisfied not full.  Encouraged pt to continue to eat balanced meals inclusive of non starchy vegetables 2 times a day 7 days a week Encouraged pt to choose lean protein sources: limiting beef, pork, sausage, hotdogs, and lunch meat Encourage pt to choose healthy fats such as plant based limiting animal fats Encouraged pt to continue to drink a minium 64 fluid ounces with half being plain water to satisfy proper hydration  Encouraged pt to increase physical activity within limitations that her heart doctor has set. Discussed being physically active can improve your brain health, help manage weight, reduce the risk of disease, strengthen bones and muscles, and improve your ability to do everyday activities. Why you need complex carbohydrates: Whole grains and other complex carbohydrates are required to have a healthy diet. Whole grains provide fiber which can help with blood glucose levels and help keep you satiated. Fruits and starchy vegetables provide essential vitamins and minerals required for immune function, eyesight support, brain support, bone density, wound healing and many other functions within the body. According to the current evidenced based 2020-2025 Dietary Guidelines for Americans, complex carbohydrates are part of a healthy eating pattern which is associated with a decreased risk for type 2 diabetes, cancers, and cardiovascular disease.   Goals: -Continue: Choose complex carbohydrates the baked potato with the skin over the tater tot or whole grain bread over cinnamon bread for example -Continue: Choose whole foods instead of the processed food the whole apple with the skin over the apple juice -Re-engage/continue: Use treadmill at home in the evening; use light weights or bands for  resistance exercises.  Handouts Provided Include  Bariatric MyPlate Benefits of Physical Activity  Learning Style & Readiness for Change Teaching method utilized: Visual & Auditory  Demonstrated degree of understanding via: Teach Back  Readiness Level: action Barriers to learning/adherence to lifestyle change: strictness of diet  RD's Notes for Next Visit Assess adherence to pt chosen goals    MONITORING & EVALUATION Dietary intake, weekly physical activity, body weight  Next Steps Patient is to follow-up in 3 months

## 2022-12-07 ENCOUNTER — Encounter (HOSPITAL_COMMUNITY): Payer: Self-pay | Admitting: Cardiology

## 2022-12-13 ENCOUNTER — Other Ambulatory Visit: Payer: Self-pay | Admitting: Physician Assistant

## 2022-12-15 ENCOUNTER — Ambulatory Visit (HOSPITAL_COMMUNITY)
Admission: RE | Admit: 2022-12-15 | Discharge: 2022-12-15 | Disposition: A | Discharge status: Home / Self Care | Payer: Commercial Managed Care - HMO | Source: Ambulatory Visit | Attending: Nurse Practitioner | Admitting: Nurse Practitioner

## 2022-12-15 VITALS — BP 128/90 | HR 126 | Ht 64.0 in | Wt 200.4 lb

## 2022-12-15 DIAGNOSIS — Z7901 Long term (current) use of anticoagulants: Secondary | ICD-10-CM | POA: Insufficient documentation

## 2022-12-15 DIAGNOSIS — Z79899 Other long term (current) drug therapy: Secondary | ICD-10-CM | POA: Diagnosis not present

## 2022-12-15 DIAGNOSIS — I10 Essential (primary) hypertension: Secondary | ICD-10-CM | POA: Insufficient documentation

## 2022-12-15 DIAGNOSIS — I480 Paroxysmal atrial fibrillation: Secondary | ICD-10-CM | POA: Diagnosis not present

## 2022-12-15 DIAGNOSIS — Z6841 Body Mass Index (BMI) 40.0 and over, adult: Secondary | ICD-10-CM | POA: Insufficient documentation

## 2022-12-15 DIAGNOSIS — E785 Hyperlipidemia, unspecified: Secondary | ICD-10-CM | POA: Diagnosis not present

## 2022-12-15 DIAGNOSIS — D6869 Other thrombophilia: Secondary | ICD-10-CM | POA: Diagnosis not present

## 2022-12-15 DIAGNOSIS — I4891 Unspecified atrial fibrillation: Secondary | ICD-10-CM

## 2022-12-15 MED ORDER — METOPROLOL TARTRATE 50 MG PO TABS: 50.0000 mg | ORAL_TABLET | Freq: Two times a day (BID) | ORAL | 3 refills | Status: AC

## 2022-12-15 NOTE — Progress Notes (Signed)
Primary Care Physician: Audrey Pao, MD Referring Physician: Ermalinda Barrios, PA   Audrey Guerra is a 57 y.o. female with a h/o morbid obesity, HTN,  history of a cardiomyopathy, gastric sleeve surgery, HTN, HLD  .   Echocardiogram from July 12, 2020 done at [facility] reveals normal left ventricular systolic function with ejection fraction of 55 to 60%. She has grade 1 diastolic dysfunction. No significant valvular abnormalities. An early event monitor showed PAF and was started on xarelto and metoprolol.  She had bariatric surgery 06/30/22 and has not been eating or drinking well with metoprolol Audrey Guerra and HCTZ stopped. She has lost 60+ lbs since surgery.   She was recently seen by Audrey Guerra and was found to be in afib with RVR. Metoprolol was restarted and referred to the afib clinic.  Today, she remains in afib with HR of 112 bpm, she has been taking a lower dose of BB in the am by mistake and will start with correct dose of  37.5 mg bid today. She states she thinks the trigger was having a change in job descriptions and very erratic hours. She has since left that job. She continues on xarelto 20 mg daily for a CHA2DS2VASc  score of 2.   F/u after cardioversion, 12/15/22. She received 2 shocks and would only hold SR for a few minutes. She is in afib with  RVR today. She does not feel the palpitations but is tired. She has a new job and is now out of network to receive care here. She saw a general cardiologist, Dr. Ferdinand Guerra in the [place] area and I would like for her to be seen in the next 1-2 weeks for f/u there and be referred to an EP to be considered for ablation vrs AAD. In the internum I will increase her metoprolol to 50 mg bid.    Today, she denies symptoms of palpitations, chest pain, shortness of breath, orthopnea, PND, lower extremity edema, dizziness, presyncope, syncope, or neurologic sequela. The patient is tolerating medications without difficulties and is otherwise  without complaint today.   Past Medical History:  Diagnosis Date   Atrial fibrillation ([place])    BMI 40.0-44.9, adult ([place])    Cardiomegaly    Hyperlipidemia    Hypertension    Morbid obesity ([place])    Osteoarthritis    PTSD (post-traumatic stress disorder)    Past Surgical History:  Procedure Laterality Date   CARDIOVERSION N/A 12/01/2022   Procedure: CARDIOVERSION;  Surgeon: Audrey Dresser, MD;  Location: [facility];  Service: Cardiovascular;  Laterality: N/A;   CESAREAN SECTION     2 previous c-sections   LAPAROSCOPIC GASTRIC SLEEVE RESECTION N/A 06/30/2022   Procedure: LAPAROSCOPIC SLEEVE GASTRECTOMY;  Surgeon: Audrey Skinner, MD;  Location: [facility];  Service: General;  Laterality: N/A;   UPPER GI ENDOSCOPY N/A 06/30/2022   Procedure: UPPER GI ENDOSCOPY;  Surgeon: Audrey Skinner, MD;  Location: [facility];  Service: General;  Laterality: N/A;    Current Outpatient Medications  Medication Sig Dispense Refill   furosemide (LASIX) 20 MG tablet Take 1 tablet (20 mg total) by mouth daily. 30 tablet 3   hydroxypropyl methylcellulose / hypromellose (ISOPTO TEARS / GONIOVISC) 2.5 % ophthalmic solution Place 1 drop into both eyes daily as needed for dry eyes.     Menthol, Topical Analgesic, (ICY HOT) 5 % PTCH Apply 1 patch topically daily as needed (Knee pain).     metoprolol tartrate (LOPRESSOR) 25 MG tablet Take  1.5 tablets (37.5 mg total) by mouth 2 (two) times daily. 180 tablet 3   ondansetron (ZOFRAN-ODT) 4 MG disintegrating tablet Dissolve 1 tablet (4 mg total) by mouth every 6 (six) hours as needed for nausea or vomiting. 20 tablet 0   pantoprazole (PROTONIX) 40 MG tablet Take 1 tablet (40 mg total) by mouth daily. 90 tablet 0   rivaroxaban (XARELTO) 20 MG TABS tablet Take 1 tablet (20 mg total) by mouth daily. 90 tablet 3   rosuvastatin (CRESTOR) 5 MG tablet Take 5 mg by mouth daily.     No current facility-administered medications for this encounter.    No  Known Allergies  Social History   Socioeconomic History   Marital status: Single    Spouse name: Not on file   Number of children: Not on file   Years of education: Not on file   Highest education level: Not on file  Occupational History   Not on file  Tobacco Use   Smoking status: Former   Smokeless tobacco: Never  Vaping Use   Vaping Use: Never used  Substance and Sexual Activity   Alcohol use: Not Currently    Comment: social   Drug use: No   Sexual activity: Yes    Birth control/protection: Condom  Other Topics Concern   Not on file  Social History Narrative   Not on file   Social Determinants of Health   Financial Resource Strain: Not on file  Food Insecurity: Not on file  Transportation Needs: Not on file  Physical Activity: Not on file  Stress: Not on file  Social Connections: Not on file  Intimate Partner Violence: Not on file    Family History  Problem Relation Age of Onset   Diabetes Mother    Diabetes Father     ROS- All systems are reviewed and negative except as per the HPI above  Physical Exam: Vitals:   12/15/22 1409  BP: (!) 128/90  Pulse: (!) 126  Weight: 90.9 kg  Height: 5\' 4"  (1.626 m)   Wt Readings from Last 3 Encounters:  12/15/22 90.9 kg  12/04/22 88.9 kg  12/01/22 89.8 kg    Labs: Lab Results  Component Value Date   NA 143 12/01/2022   K 3.4 (L) 12/01/2022   CL 107 12/01/2022   CO2 25 12/01/2022   GLUCOSE 110 (H) 12/01/2022   BUN 12 12/01/2022   CREATININE 1.07 (H) 12/01/2022   CALCIUM 9.1 12/01/2022   MG 1.8 07/07/2022   No results found for: "INR" No results found for: "CHOL", "HDL", "[place]", "TRIG"   GEN- The patient is well appearing, alert and oriented x 3 today.   Head- normocephalic, atraumatic Eyes-  Sclera clear, conjunctiva pink Ears- hearing intact Oropharynx- clear Neck- supple, no JVP Lymph- no cervical lymphadenopathy Lungs- Clear to ausculation bilaterally, normal work of breathing Heart-  irregular rate and rhythm, no murmurs, rubs or gallops, PMI not laterally displaced GI- soft, NT, ND, + BS Extremities- no clubbing, cyanosis, or edema MS- no significant deformity or atrophy Skin- no rash or lesion Psych- euthymic mood, full affect Neuro- strength and sensation are intact  EKG- Vent. rate 126 BPM PR interval * ms QRS duration 68 ms QT/QTcB 334/483 ms P-R-T axes * 73 214 Atrial fibrillation with rapid ventricular response with premature ventricular or aberrantly conducted complexes Nonspecific T wave abnormality Abnormal ECG When compared with ECG of 25-Nov-2022 11:20, PREVIOUS ECG IS PRESENT  EPIC records reviewed  Echo - 1. Left  ventricular ejection fraction, by estimation, is 55 to 60%. The  left ventricle has normal function. The left ventricle has no regional  wall motion abnormalities. Left ventricular diastolic parameters were  normal.   2. Right ventricular systolic function is normal. The right ventricular  size is normal. Tricuspid regurgitation signal is inadequate for assessing  PA pressure.   3. The mitral valve is normal in structure. No evidence of mitral valve  regurgitation.   4. The aortic valve is tricuspid. Aortic valve regurgitation is not  visualized.   5. The inferior vena cava is normal in size with greater than 50%  respiratory variability, suggesting right atrial pressure of 3 mmHg.   Comparison(s): No prior Echocardiogram.   Conclusion(s)/Recommendation(s): Normal biventricular function without  evidence of hemodynamically significant valvular heart disease.    Assessment and Plan:  1. PAF Pt has been in afib for unknown period of time Unsuccessful cardioversion with 2 shocks but only holding Sinus rhythm for a few minutes  Increase metoprolol to 50  mg bid and will be referred back to her cardiologist, Dr. Terance Hart in [facility], as her insurance has changed and she is now out of network  She will ultimately need to  be referred to an  EP in the  [place] area for ablation vrs AAD No caffeine or tobacco  2. CHA2DS2VASc  score of 2 Continue xarelto 20 mg daily  States no missed doses    3. HTN Stable   F/u with prior cardiologist in the [place] area in 1-2 weeks    Butch Penny C. Shaley Leavens, [facility] [address] [place],  [address] [phone]

## 2022-12-15 NOTE — Patient Instructions (Signed)
Increase metoprolol to 50mg twice a day 

## 2023-01-12 NOTE — Telephone Encounter (Signed)
This encounter was created in error - please disregard.

## 2023-01-21 ENCOUNTER — Encounter (HOSPITAL_COMMUNITY): Payer: Self-pay | Admitting: *Deleted

## 2023-03-23 ENCOUNTER — Ambulatory Visit: Payer: Commercial Managed Care - HMO | Admitting: Dietician

## 2023-03-28 ENCOUNTER — Emergency Department (HOSPITAL_COMMUNITY)
Admission: EM | Admit: 2023-03-28 | Discharge: 2023-03-28 | Disposition: A | Discharge status: Home / Self Care | Payer: BLUE CROSS/BLUE SHIELD | Attending: Emergency Medicine | Admitting: Emergency Medicine

## 2023-03-28 ENCOUNTER — Encounter (HOSPITAL_COMMUNITY): Payer: Self-pay | Admitting: Emergency Medicine

## 2023-03-28 ENCOUNTER — Emergency Department (HOSPITAL_COMMUNITY): Payer: BLUE CROSS/BLUE SHIELD

## 2023-03-28 DIAGNOSIS — Y92009 Unspecified place in unspecified non-institutional (private) residence as the place of occurrence of the external cause: Secondary | ICD-10-CM | POA: Insufficient documentation

## 2023-03-28 DIAGNOSIS — M79674 Pain in right toe(s): Secondary | ICD-10-CM | POA: Diagnosis present

## 2023-03-28 DIAGNOSIS — X501XXA Overexertion from prolonged static or awkward postures, initial encounter: Secondary | ICD-10-CM | POA: Diagnosis not present

## 2023-03-28 DIAGNOSIS — M25571 Pain in right ankle and joints of right foot: Secondary | ICD-10-CM | POA: Insufficient documentation

## 2023-03-28 MED ORDER — OXYCODONE-ACETAMINOPHEN 5-325 MG PO TABS: 1.0000 | ORAL_TABLET | Freq: Four times a day (QID) | ORAL | 0 refills | Status: DC | PRN

## 2023-03-28 MED ORDER — OXYCODONE-ACETAMINOPHEN 5-325 MG PO TABS
1.0000 | ORAL_TABLET | Freq: Once | ORAL | Status: AC
  Administered 2023-03-28: 1 via ORAL
  Filled 2023-03-28: qty 1

## 2023-03-28 NOTE — ED Triage Notes (Addendum)
Pt slipped Wedn and says left foot pain getting worse. Has been ambulating on it all week. States she cannot bend great toe. Pt requesting something immediately for pain. RN offered tylenol and she declined.

## 2023-03-28 NOTE — Progress Notes (Signed)
Orthopedic Tech Progress Note Patient Details:  Audrey Guerra 1966/11/16 [id number]  Ortho Devices Type of Ortho Device: CAM walker, Crutches Ortho Device/Splint Location: rle Ortho Device/Splint Interventions: Ordered, Application, Adjustment   Post Interventions Patient Tolerated: Well Instructions Provided: Care of device, Adjustment of device  Trinna Post 03/28/2023, 6:55 AM

## 2023-03-28 NOTE — Discharge Instructions (Signed)
Please take pain medication as prescribed.  Do not take it if you do not need it.  You can also take ibuprofen.  Do not mix with any other acetaminophen (Tylenol).  Please follow-up with the orthopedic doctor listed.  You should not drive while in pain or while wearing the boot.  You may apply ice or heat throughout the day.  Return for fever or redness.  Your x-rays were negative for fracture.

## 2023-03-28 NOTE — ED Provider Notes (Signed)
[facility] Emergency Department Provider Note MRN:  [id number]  Arrival date & time: 03/28/23     Chief Complaint   Foot Pain   History of Present Illness   Audrey Guerra is a 57 y.o. year-old female presents to the ED with chief complaint of right foot and toe pain.  States that on Tuesday she slipped while at home and twisted her ankle and foot.  She states that she has been tolerating the pain and continuing through her daily routine for the past few days.  States that finally tonight the pain was such that she could not sleep and so she came to the emergency department for evaluation.  She denies any significant treatments prior to arrival.  She reports decreased range of motion in the ankle and the great toe.  No reported fevers.  No history of gout.  History provided by patient.   Review of Systems  Pertinent positive and negative review of systems noted in HPI.    Physical Exam   Vitals:   03/28/23 0448  BP: 130/85  Pulse: (!) 55  Resp: 16  Temp: (!) 97.5 F (36.4 C)  SpO2: 100%    CONSTITUTIONAL:  well-appearing, NAD NEURO:  Alert and oriented x 3, CN 3-12 grossly intact EYES:  eyes equal and reactive ENT/NECK:  Supple, no stridor  CARDIO:  normal rate, regular rhythm, appears well-perfused, normal cap refill of great toe, distal pulses are palpable PULM:  No respiratory distress,  GI/GU:  non-distended,  MSK/SPINE:  No gross deformities, no edema, moves all extremities  SKIN:  no rash, atraumatic   *Additional and/or pertinent findings included in MDM below  Diagnostic and Interventional Summary    EKG Interpretation  Date/Time:    Ventricular Rate:    PR Interval:    QRS Duration:   QT Interval:    QTC Calculation:   R Axis:     Text Interpretation:         Labs Reviewed - No data to display  DG Foot Complete Right  Final Result      Medications  oxyCODONE-acetaminophen (PERCOCET/ROXICET) 5-325 MG per tablet 1  tablet (has no administration in time range)     Procedures  /  Critical Care Procedures  ED Course and Medical Decision Making  I have reviewed the triage vital signs, the nursing notes, and pertinent available records from the EMR.  Social Determinants Affecting Complexity of Care: Patient has no clinically significant social determinants affecting this chief complaint..   ED Course:    Medical Decision Making Patient with right foot and toe pain after an injury that occurred earlier this week in which she rolled her ankle.  Plain films ordered in triage are negative for fracture.  There is some spurring at the first MTP joint.  Patient's foot is warm, has brisk cap refill, normal sensation.  Will have patient follow-up with orthopedics.  Cam boot and crutches.  Amount and/or Complexity of Data Reviewed Radiology: ordered and independent interpretation performed.    Details: No obvious fracture  Risk Prescription drug management.     Consultants: No consultations were needed in caring for this patient.   Treatment and Plan: Emergency department workup does not suggest an emergent condition requiring admission or immediate intervention beyond  what has been performed at this time. The patient is safe for discharge and has  been instructed to return immediately for worsening symptoms, change in  symptoms or any other concerns  Final Clinical Impressions(s) / ED Diagnoses     ICD-10-CM   1. Pain of toe of right foot  M79.674     2. Acute right ankle pain  M25.571       ED Discharge Orders          Ordered    oxyCODONE-acetaminophen (PERCOCET) 5-325 MG tablet  Every 6 hours PRN        03/28/23 0617              Discharge Instructions Discussed with and Provided to Patient:    Discharge Instructions      Please take pain medication as prescribed.  Do not take it if you do not need it.  You can also take ibuprofen.  Do not mix with any other  acetaminophen (Tylenol).  Please follow-up with the orthopedic doctor listed.  You should not drive while in pain or while wearing the boot.  You may apply ice or heat throughout the day.  Return for fever or redness.  Your x-rays were negative for fracture.      Roxy Horseman, PA-C 03/28/23 Kyra Searles, MD 03/28/23 [phone]

## 2023-04-13 ENCOUNTER — Other Ambulatory Visit: Payer: Self-pay | Admitting: Internal Medicine

## 2023-04-13 DIAGNOSIS — Z0000 Encounter for general adult medical examination without abnormal findings: Secondary | ICD-10-CM

## 2023-04-28 ENCOUNTER — Ambulatory Visit: Payer: BLUE CROSS/BLUE SHIELD

## 2023-04-29 ENCOUNTER — Ambulatory Visit: Payer: BLUE CROSS/BLUE SHIELD

## 2023-10-12 ENCOUNTER — Other Ambulatory Visit (HOSPITAL_COMMUNITY): Payer: Self-pay

## 2024-02-04 ENCOUNTER — Encounter (HOSPITAL_COMMUNITY): Payer: Self-pay | Admitting: *Deleted

## 2024-05-19 ENCOUNTER — Emergency Department (HOSPITAL_COMMUNITY)
Admission: EM | Admit: 2024-05-19 | Discharge: 2024-05-20 | Disposition: A | Discharge status: Home / Self Care | Attending: Emergency Medicine | Admitting: Emergency Medicine

## 2024-05-19 ENCOUNTER — Encounter (HOSPITAL_COMMUNITY): Payer: Self-pay

## 2024-05-19 ENCOUNTER — Other Ambulatory Visit: Payer: Self-pay

## 2024-05-19 DIAGNOSIS — M25511 Pain in right shoulder: Secondary | ICD-10-CM | POA: Diagnosis present
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
## 2024-05-20 ENCOUNTER — Emergency Department (HOSPITAL_COMMUNITY)

## 2024-05-20 MED ORDER — OXYCODONE-ACETAMINOPHEN 5-325 MG PO TABS: 1.0000 | ORAL_TABLET | Freq: Four times a day (QID) | ORAL | 0 refills | Status: AC | PRN

## 2024-05-20 MED ORDER — OXYCODONE-ACETAMINOPHEN 5-325 MG PO TABS
2.0000 | ORAL_TABLET | Freq: Once | ORAL | Status: AC
  Administered 2024-05-20: 2 via ORAL
  Filled 2024-05-20: qty 2
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
## 2024-07-23 ENCOUNTER — Ambulatory Visit (HOSPITAL_COMMUNITY)
Admission: EM | Admit: 2024-07-23 | Discharge: 2024-07-23 | Disposition: A | Discharge status: Home / Self Care | Attending: Physician Assistant | Admitting: Physician Assistant

## 2024-07-23 ENCOUNTER — Encounter (HOSPITAL_COMMUNITY): Payer: Self-pay

## 2024-07-23 ENCOUNTER — Ambulatory Visit (INDEPENDENT_AMBULATORY_CARE_PROVIDER_SITE_OTHER)

## 2024-07-23 DIAGNOSIS — S93402A Sprain of unspecified ligament of left ankle, initial encounter: Secondary | ICD-10-CM | POA: Diagnosis not present

## 2024-07-23 DIAGNOSIS — S8001XA Contusion of right knee, initial encounter: Secondary | ICD-10-CM

## 2024-07-23 DIAGNOSIS — M25561 Pain in right knee: Secondary | ICD-10-CM

## 2024-07-23 DIAGNOSIS — M25572 Pain in left ankle and joints of left foot: Secondary | ICD-10-CM

## 2024-07-23 DIAGNOSIS — M545 Low back pain, unspecified: Secondary | ICD-10-CM | POA: Diagnosis not present

## 2024-07-23 DIAGNOSIS — S39012A Strain of muscle, fascia and tendon of lower back, initial encounter: Secondary | ICD-10-CM

## 2024-07-23 DIAGNOSIS — W19XXXA Unspecified fall, initial encounter: Secondary | ICD-10-CM

## 2024-07-23 MED ORDER — LIDOCAINE 5 % EX OINT: 1.0000 | TOPICAL_OINTMENT | CUTANEOUS | 0 refills | Status: AC | PRN

## 2024-07-23 NOTE — Discharge Instructions (Addendum)
 I did not see anything broken on your x-ray but I am waiting for the radiologist to read over me.  If they see something that I did not and it changes what we are going to do I will call you.  Otherwise, I will send you a message through MyChart.  Keep your knee and ankle elevated and apply ice for 15 minutes at a time 3-4 times a day.  Use the brace on your knee and ankle to help provide support and comfort.  Avoid strenuous activity.  Continue your previously prescribed medication including oxycodone .  I have also called in lidocaine  ointment that you can apply to the area up to 2 times a day.  Follow-up with sports medicine as soon as possible.  Call them to schedule an appointment.  If anything worsens please return for reevaluation.

## 2024-07-23 NOTE — ED Triage Notes (Signed)
 Pt states that she fell and injured her right knee. X1 day  Pt states that she now has pain in her right knee that radiates down her leg.

## 2024-07-23 NOTE — ED Provider Notes (Signed)
 [facility]    CSN: [id number] Arrival date & time: 07/23/24  1121      History   Chief Complaint Chief Complaint  Patient presents with   Knee Injury    HPI Audrey Guerra is a 58 y.o. female.   Patient presents today for evaluation of pain in her back, ankle, knee after a fall that occurred a few hours ago.  She was doing a store when she slipped on some water causing her left ankle to invert and falling onto her right knee.  She did not hit her head and denies any loss of consciousness, nausea, vomiting, dizziness, amnesia surrounding event.  She is chronically anticoagulated for atrial fibrillation but is confident that she did not hit her head.  She reports that the ankle pain is minimal and rated 3 on a 0-10 to pain scale, localized to her lateral ankle without radiation, no aggravating relieving factors identified.  She reports that the pain in her knee is much worse and is rated 7 on a 0 to pain scale, localized to her anterior inferior knee with radiation to her leg, described as sharp, no aggravating relieving factors notified.  She has not taken any over-the-counter medication for symptom management.  Denies previous injury or surgery to her back, ankle, knee.  She is having difficulty ambulating as result of the pain.  On exam she was noted to have significant tenderness in her lower back.  Denies any bowel/bladder incontinence, lower extremity weakness, saddle anesthesia.    Past Medical History:  Diagnosis Date   Atrial fibrillation (HCC)    BMI 40.0-44.9, adult (HCC)    Cardiomegaly    Hyperlipidemia    Hypertension    Morbid obesity (HCC)    Osteoarthritis    PTSD (post-traumatic stress disorder)     Patient Active Problem List   Diagnosis Date Noted   Dehydration 07/06/2022   Class II obesity 06/30/2022   Atrial fibrillation (HCC) 07/14/2021   Chronic diastolic CHF (congestive heart failure), NYHA class 1 (HCC) 07/14/2021   Morbid obesity (HCC)  07/14/2021    Past Surgical History:  Procedure Laterality Date   CARDIOVERSION N/A 12/01/2022   Procedure: CARDIOVERSION;  Surgeon: Lonni Slain, MD;  Location: [facility] ENDOSCOPY;  Service: Cardiovascular;  Laterality: N/A;   CESAREAN SECTION     2 previous c-sections   LAPAROSCOPIC GASTRIC SLEEVE RESECTION N/A 06/30/2022   Procedure: LAPAROSCOPIC SLEEVE GASTRECTOMY;  Surgeon: Stevie Herlene Righter, MD;  Location: [facility] ORS;  Service: General;  Laterality: N/A;   UPPER GI ENDOSCOPY N/A 06/30/2022   Procedure: UPPER GI ENDOSCOPY;  Surgeon: Stevie, Herlene Righter, MD;  Location: [facility] ORS;  Service: General;  Laterality: N/A;    OB History     Gravida  3   Para  2   Term  2   Preterm      AB  1   Living  2      SAB      IAB  1   Ectopic      Multiple      Live Births               Home Medications    Prior to Admission medications   Medication Sig Start Date End Date Taking? Authorizing Provider  furosemide  (LASIX ) 20 MG tablet TAKE 1 TABLET(20 MG) BY MOUTH DAILY 12/16/22  Yes Nahser, Aleene PARAS, MD  hydroxypropyl methylcellulose / hypromellose (ISOPTO TEARS / GONIOVISC) 2.5 % ophthalmic solution Place 1 drop  into both eyes daily as needed for dry eyes.   Yes [provider]  lidocaine  (XYLOCAINE ) 5 % ointment Apply 1 Application topically as needed. 07/23/24  Yes Sadee Osland K, PA-C  Menthol, Topical Analgesic, (ICY HOT) 5 % PTCH Apply 1 patch topically daily as needed (Knee pain).   Yes [provider]  metoprolol  tartrate (LOPRESSOR ) 50 MG tablet Take 1 tablet (50 mg total) by mouth 2 (two) times daily. 12/15/22  Yes Dow Arland BROCKS, NP  oxyCODONE -acetaminophen  (PERCOCET/ROXICET) 5-325 MG tablet Take 1-2 tablets by mouth every 6 (six) hours as needed for severe pain (pain score 7-10). 05/20/24  Yes Vicky Charleston, PA-C  pantoprazole  (PROTONIX ) 40 MG tablet Take 1 tablet (40 mg total) by mouth daily. 07/01/22  Yes Kinsinger, Herlene Righter, MD   rivaroxaban  (XARELTO ) 20 MG TABS tablet Take 1 tablet (20 mg total) by mouth daily. 07/04/22 07/23/24 Yes Kinsinger, Herlene Righter, MD  rosuvastatin  (CRESTOR ) 5 MG tablet Take 5 mg by mouth daily.   Yes [provider]    Family History Family History  Problem Relation Age of Onset   Diabetes Mother    Diabetes Father     Social History Social History   Tobacco Use   Smoking status: Former   Smokeless tobacco: Never  Advertising account planner   Vaping status: Never Used  Substance Use Topics   Alcohol  use: Not Currently    Comment: social   Drug use: No     Allergies   Patient has no known allergies.   Review of Systems Review of Systems  Constitutional:  Positive for activity change. Negative for appetite change, fatigue and fever.  Gastrointestinal:  Negative for nausea and vomiting.  Musculoskeletal:  Positive for arthralgias, back pain and gait problem. Negative for joint swelling and myalgias.  Skin:  Positive for color change (Bruise on anterior knee). Negative for wound.  Neurological:  Negative for dizziness, syncope, weakness, light-headedness, numbness and headaches.     Physical Exam Triage Vital Signs ED Triage Vitals  Encounter Vitals Group     BP 07/23/24 1200 102/71     Girls Systolic BP Percentile --      Girls Diastolic BP Percentile --      Boys Systolic BP Percentile --      Boys Diastolic BP Percentile --      Pulse Rate 07/23/24 1200 87     Resp 07/23/24 1200 17     Temp 07/23/24 1200 97.8 F (36.6 C)     Temp Source 07/23/24 1200 Oral     SpO2 07/23/24 1200 98 %     Weight 07/23/24 1157 234 lb (106.1 kg)     Height 07/23/24 1157 5' 5 (1.651 m)     Head Circumference --      Peak Flow --      Pain Score 07/23/24 1157 7     Pain Loc --      Pain Education --      Exclude from Growth Chart --    No data found.  Updated Vital Signs BP 102/71 (BP Location: Left Arm)   Pulse 87   Temp 97.8 F (36.6 C) (Oral)   Resp 17   Ht 5' 5 (1.651  m)   Wt 234 lb (106.1 kg)   SpO2 98%   BMI 38.94 kg/m   Visual Acuity Right Eye Distance:   Left Eye Distance:   Bilateral Distance:    Right Eye Near:   Left Eye Near:  Bilateral Near:     Physical Exam Vitals reviewed.  Constitutional:      General: She is awake. She is not in acute distress.    Appearance: Normal appearance. She is well-developed. She is not ill-appearing.     Comments: Very pleasant female appears stated age in no acute distress sitting comfortably in exam room  HENT:     Head: Normocephalic and atraumatic.  Cardiovascular:     Rate and Rhythm: Normal rate and regular rhythm.     Heart sounds: Normal heart sounds, S1 normal and S2 normal. No murmur heard. Pulmonary:     Effort: Pulmonary effort is normal.     Breath sounds: Normal breath sounds. No wheezing, rhonchi or rales.     Comments: Clear to auscultation bilaterally Musculoskeletal:     Cervical back: No tenderness or bony tenderness.     Thoracic back: No tenderness or bony tenderness.     Lumbar back: Tenderness and bony tenderness present. Negative right straight leg raise test and negative left straight leg raise test.     Right knee: Swelling present. No deformity. Decreased range of motion. Tenderness present over the medial joint line. No LCL laxity, MCL laxity, ACL laxity or PCL laxity.     Comments: Back: Pain percussion of lumbar vertebrae.  No deformity or step-off noted.  Significant tenderness palpation of bilateral lumbar paraspinal muscles worse on the left.  Strength 5/5 bilateral lower extremities.  Decreased range of motion with rotation secondary to pain.    Left ankle: Tenderness palpation with associated swelling over lateral malleolus.  No deformity noted.  Foot is neurovascularly intact.  Normal plantar and dorsiflexion.  Right knee: Tender to palpation over anterior knee worse over medial tibial plateau.  No deformity noted.  No ligamentous laxity on exam.  Significant  tenderness palpation over tibia.  Right foot neurovascularly intact.  Psychiatric:        Behavior: Behavior is cooperative.      UC Treatments / Results  Labs (all labs ordered are listed, but only abnormal results are displayed) Labs Reviewed - No data to display  EKG   Radiology DG Lumbar Spine Complete Result Date: 07/23/2024 CLINICAL DATA:  Pain after a fall. EXAM: LUMBAR SPINE - COMPLETE 4+ VIEW COMPARISON:  None Available. FINDINGS: Mild dextroconvex curvature. Overall image quality is degraded by body habitus. Minimal grade 1 anterolisthesis of L4 on L5. Alignment is otherwise anatomic. Multilevel endplate degenerative changes. Loss of disc space height at L4-5 and L5-S1. Facet hypertrophy throughout the lumbar spine. IMPRESSION: 1. No acute findings. 2. Multilevel degenerative disc disease, worst at L4-5 and L5-S1. 3. Multilevel facet hypertrophy. Electronically Signed   By: Newell Eke M.D.   On: 07/23/2024 13:48   DG Ankle Complete Left Result Date: 07/23/2024 CLINICAL DATA:  Pain after a fall. EXAM: LEFT ANKLE COMPLETE - 3+ VIEW COMPARISON:  None Available. FINDINGS: Mild lateral soft tissue swelling.  No fracture or dislocation. IMPRESSION: No fracture or dislocation. Electronically Signed   By: Newell Eke M.D.   On: 07/23/2024 13:47   DG Knee Complete 4 Views Right Result Date: 07/23/2024 CLINICAL DATA:  Pain after a fall. EXAM: RIGHT KNEE - COMPLETE 4+ VIEW COMPARISON:  None Available. FINDINGS: No joint effusion or fracture. IMPRESSION: Negative. Electronically Signed   By: Newell Eke M.D.   On: 07/23/2024 13:47    Procedures Procedures (including critical care time)  Medications Ordered in UC Medications - No data to display  Initial Impression /  Assessment and Plan / UC Course  I have reviewed the triage vital signs and the nursing notes.  Pertinent labs & imaging results that were available during my care of the patient were reviewed by me and  considered in my medical decision making (see chart for details).     Patient is well-appearing, afebrile, nontoxic, nontachycardic.  X-rays of lumbar spine, ankle, knee were obtained given bony tenderness following recent trauma that showed no acute osseous abnormality but did show degenerative changes.  X-rays resulted after AVS was printed before patient was discharged and she was informed of these results during her visit.  Discussed conservative treat measures including elevation, ice, compression.  She was placed in a ankle and knee brace for comfort and support.  She is unable to take systemic NSAIDs as she is chronically anticoagulated with Xarelto  so was encouraged to use Tylenol  as well as previously prescribed oxycodone  to manage her pain.  She was given topical lidocaine  cream to help with her symptoms.  Recommend she follow-up closely with sports medicine for further evaluation and management.  We discussed that if her symptoms worsen in any way and she has increasing pain, difficulty ambulating, numbness or paresthesias in her feet, additional symptoms she needs to be seen immediately.  Should return precautions given.  Excuse note provided.  All questions answered to patient satisfaction.  Final Clinical Impressions(s) / UC Diagnoses   Final diagnoses:  Acute pain of right knee  Acute left ankle pain  Acute midline low back pain without sciatica  Contusion of right knee, initial encounter  Sprain of left ankle, unspecified ligament, initial encounter  Strain of lumbar region, initial encounter  Fall, initial encounter     Discharge Instructions      I did not see anything broken on your x-ray but I am waiting for the radiologist to read over me.  If they see something that I did not and it changes what we are going to do I will call you.  Otherwise, I will send you a message through MyChart.  Keep your knee and ankle elevated and apply ice for 15 minutes at a time 3-4 times a  day.  Use the brace on your knee and ankle to help provide support and comfort.  Avoid strenuous activity.  Continue your previously prescribed medication including oxycodone .  I have also called in lidocaine  ointment that you can apply to the area up to 2 times a day.  Follow-up with sports medicine as soon as possible.  Call them to schedule an appointment.  If anything worsens please return for reevaluation.     ED Prescriptions     Medication Sig Dispense Auth. Provider   lidocaine  (XYLOCAINE ) 5 % ointment Apply 1 Application topically as needed. 50 g Lashan Macias K, PA-C      PDMP not reviewed this encounter.   Sherrell Rocky POUR, PA-C 07/23/24 1405

## 2024-08-23 ENCOUNTER — Encounter (HOSPITAL_COMMUNITY): Payer: Self-pay

## 2024-08-23 ENCOUNTER — Ambulatory Visit (HOSPITAL_COMMUNITY)
Admission: EM | Admit: 2024-08-23 | Discharge: 2024-08-23 | Disposition: A | Discharge status: Home / Self Care | Attending: Physician Assistant | Admitting: Physician Assistant

## 2024-08-23 DIAGNOSIS — L02419 Cutaneous abscess of limb, unspecified: Secondary | ICD-10-CM | POA: Insufficient documentation

## 2024-08-23 LAB — CBC WITH DIFFERENTIAL/PLATELET
Abs Immature Granulocytes: 0.11 K/uL — ABNORMAL HIGH (ref 0.00–0.07)
Basophils Absolute: 0.1 K/uL (ref 0.0–0.1)
Basophils Relative: 0 %
Eosinophils Absolute: 0.2 K/uL (ref 0.0–0.5)
Eosinophils Relative: 1 %
HCT: 41 % (ref 36.0–46.0)
Hemoglobin: 12.9 g/dL (ref 12.0–15.0)
Immature Granulocytes: 1 %
Lymphocytes Relative: 12 %
Lymphs Abs: 1.8 K/uL (ref 0.7–4.0)
MCH: 27.7 pg (ref 26.0–34.0)
MCHC: 31.5 g/dL (ref 30.0–36.0)
MCV: 88 fL (ref 80.0–100.0)
Monocytes Absolute: 1.4 K/uL — ABNORMAL HIGH (ref 0.1–1.0)
Monocytes Relative: 9 %
Neutro Abs: 11.5 K/uL — ABNORMAL HIGH (ref 1.7–7.7)
Neutrophils Relative %: 77 %
Platelets: 249 K/uL (ref 150–400)
RBC: 4.66 MIL/uL (ref 3.87–5.11)
RDW: 14.5 % (ref 11.5–15.5)
WBC: 15 K/uL — ABNORMAL HIGH (ref 4.0–10.5)
nRBC: 0 % (ref 0.0–0.2)

## 2024-08-23 LAB — BASIC METABOLIC PANEL WITH GFR
Anion gap: 15 (ref 5–15)
BUN: 10 mg/dL (ref 6–20)
CO2: 23 mmol/L (ref 22–32)
Calcium: 8.5 mg/dL — ABNORMAL LOW (ref 8.9–10.3)
Chloride: 100 mmol/L (ref 98–111)
Creatinine, Ser: 0.97 mg/dL (ref 0.44–1.00)
GFR, Estimated: >60 mL/min (ref >60)
Glucose, Bld: 80 mg/dL (ref 70–99)
Potassium: 4.4 mmol/L (ref 3.5–5.1)
Sodium: 138 mmol/L (ref 135–145)

## 2024-08-23 MED ORDER — SULFAMETHOXAZOLE-TRIMETHOPRIM 800-160 MG PO TABS: 1.0000 | ORAL_TABLET | Freq: Two times a day (BID) | ORAL | 0 refills | Status: AC

## 2024-08-23 MED ORDER — CEPHALEXIN 500 MG PO CAPS: 500.0000 mg | ORAL_CAPSULE | Freq: Four times a day (QID) | ORAL | 0 refills | Status: AC

## 2024-08-23 NOTE — ED Triage Notes (Signed)
 Patient here today with c/o an abscess on right axilla since last Friday. Patient states that on Monday it popped and has been draining but still has some areas that are hard. Patient has been applying Neosporin to it.

## 2024-08-23 NOTE — ED Provider Notes (Addendum)
 [facility]    CSN: [id number] Arrival date & time: 08/23/24  1443      History   Chief Complaint Chief Complaint  Patient presents with   Abscess    HPI Audrey Guerra is a 58 y.o. female.   Patient presents today with a 1 week history of lesion in her right axilla.  She reports that it is very painful with pain being rated 6 on a surgical pain scale, described as aching, no aggravating alleviating factors identified.  She has been applying Neosporin and noticed that it began draining about 2 days ago but it continues to be hard and painful.  She denies history of recurrent skin infections or MRSA.  Denies history of hidradenitis suppurativa.  She has not been taking any over-the-counter medication.  Denies any fever, nausea, vomiting.  She denies any recent antibiotics.  She is having difficulty with her daily activities as she is right-handed and any movement of her arm causes increasing pain.    Past Medical History:  Diagnosis Date   Atrial fibrillation (HCC)    BMI 40.0-44.9, adult (HCC)    Cardiomegaly    Hyperlipidemia    Hypertension    Morbid obesity (HCC)    Osteoarthritis    PTSD (post-traumatic stress disorder)     Patient Active Problem List   Diagnosis Date Noted   Dehydration 07/06/2022   Class II obesity 06/30/2022   Atrial fibrillation (HCC) 07/14/2021   Chronic diastolic CHF (congestive heart failure), NYHA class 1 (HCC) 07/14/2021   Morbid obesity (HCC) 07/14/2021    Past Surgical History:  Procedure Laterality Date   CARDIOVERSION N/A 12/01/2022   Procedure: CARDIOVERSION;  Surgeon: Lonni Slain, MD;  Location: [facility] ENDOSCOPY;  Service: Cardiovascular;  Laterality: N/A;   CESAREAN SECTION     2 previous c-sections   LAPAROSCOPIC GASTRIC SLEEVE RESECTION N/A 06/30/2022   Procedure: LAPAROSCOPIC SLEEVE GASTRECTOMY;  Surgeon: Stevie Herlene Righter, MD;  Location: [facility] ORS;  Service: General;  Laterality: N/A;   UPPER GI  ENDOSCOPY N/A 06/30/2022   Procedure: UPPER GI ENDOSCOPY;  Surgeon: Stevie, Herlene Righter, MD;  Location: [facility] ORS;  Service: General;  Laterality: N/A;    OB History     Gravida  3   Para  2   Term  2   Preterm      AB  1   Living  2      SAB      IAB  1   Ectopic      Multiple      Live Births               Home Medications    Prior to Admission medications   Medication Sig Start Date End Date Taking? Authorizing Provider  cephALEXin  (KEFLEX ) 500 MG capsule Take 1 capsule (500 mg total) by mouth 4 (four) times daily. 08/23/24  Yes Calieb Lichtman K, PA-C  sulfamethoxazole -trimethoprim  (BACTRIM  DS) 800-160 MG tablet Take 1 tablet by mouth 2 (two) times daily for 7 days. 08/23/24 08/30/24 Yes Taylorann Tkach K, PA-C  furosemide  (LASIX ) 20 MG tablet TAKE 1 TABLET(20 MG) BY MOUTH DAILY 12/16/22   Nahser, Aleene PARAS, MD  hydroxypropyl methylcellulose / hypromellose (ISOPTO TEARS / GONIOVISC) 2.5 % ophthalmic solution Place 1 drop into both eyes daily as needed for dry eyes.    [provider]  lidocaine  (XYLOCAINE ) 5 % ointment Apply 1 Application topically as needed. 07/23/24   Amyri Frenz K, PA-C  Menthol, Topical  Analgesic, (ICY HOT) 5 % PTCH Apply 1 patch topically daily as needed (Knee pain).    [provider]  metoprolol  tartrate (LOPRESSOR ) 50 MG tablet Take 1 tablet (50 mg total) by mouth 2 (two) times daily. 12/15/22   Dow Arland BROCKS, NP  oxyCODONE -acetaminophen  (PERCOCET/ROXICET) 5-325 MG tablet Take 1-2 tablets by mouth every 6 (six) hours as needed for severe pain (pain score 7-10). 05/20/24   Vicky Charleston, PA-C  pantoprazole  (PROTONIX ) 40 MG tablet Take 1 tablet (40 mg total) by mouth daily. 07/01/22   Kinsinger, Herlene Righter, MD  rivaroxaban  (XARELTO ) 20 MG TABS tablet Take 1 tablet (20 mg total) by mouth daily. 07/04/22 07/23/24  Kinsinger, Herlene Righter, MD  rosuvastatin  (CRESTOR ) 5 MG tablet Take 5 mg by mouth daily.    [provider]     Family History Family History  Problem Relation Age of Onset   Diabetes Mother    Diabetes Father     Social History Social History   Tobacco Use   Smoking status: Former   Smokeless tobacco: Never  Advertising account planner   Vaping status: Never Used  Substance Use Topics   Alcohol  use: Not Currently    Comment: social   Drug use: No     Allergies   Patient has no known allergies.   Review of Systems Review of Systems  Constitutional:  Positive for activity change. Negative for appetite change, fatigue and fever.  Gastrointestinal:  Negative for nausea and vomiting.  Musculoskeletal:  Positive for myalgias. Negative for arthralgias.  Skin:  Positive for wound. Negative for color change.  Neurological:  Negative for weakness and numbness.     Physical Exam Triage Vital Signs ED Triage Vitals [08/23/24 1459]  Encounter Vitals Group     BP 120/80     Girls Systolic BP Percentile      Girls Diastolic BP Percentile      Boys Systolic BP Percentile      Boys Diastolic BP Percentile      Pulse Rate (!) 103     Resp 16     Temp 98.5 F (36.9 C)     Temp Source Oral     SpO2 94 %     Weight      Height      Head Circumference      Peak Flow      Pain Score 6     Pain Loc      Pain Education      Exclude from Growth Chart    No data found.  Updated Vital Signs BP 120/80 (BP Location: Left Arm)   Pulse 95   Temp 98.5 F (36.9 C) (Oral)   Resp 16   SpO2 94%   Visual Acuity Right Eye Distance:   Left Eye Distance:   Bilateral Distance:    Right Eye Near:   Left Eye Near:    Bilateral Near:     Physical Exam Vitals reviewed.  Constitutional:      General: She is awake. She is not in acute distress.    Appearance: Normal appearance. She is well-developed. She is not ill-appearing.     Comments: Very pleasant female appears stated age in no acute distress sitting comfortably in exam room  HENT:     Head: Normocephalic and atraumatic.  Cardiovascular:      Rate and Rhythm: Normal rate and regular rhythm.     Heart sounds: Normal heart sounds, S1 normal and S2 normal. No  murmur heard. Pulmonary:     Effort: Pulmonary effort is normal.     Breath sounds: Normal breath sounds. No wheezing, rhonchi or rales.     Comments: Clear to auscultation bilaterally Abdominal:     Palpations: Abdomen is soft.     Tenderness: There is no abdominal tenderness.  Skin:    Findings: Abscess present.     Comments: Approximately 1.5 cm x 1 cm ulcerated lesion with active purulent drainage noted inferior axilla with surrounding induration measuring approximately 6 cm x 7 cm chest wall without fluctuance.  Area is tender to palpation.  Overlying erythema of indurated region without streaking or evidence of lymphangitis.  Psychiatric:        Behavior: Behavior is cooperative.      UC Treatments / Results  Labs (all labs ordered are listed, but only abnormal results are displayed) Labs Reviewed  CBC WITH DIFFERENTIAL/PLATELET  BASIC METABOLIC PANEL WITH GFR    EKG   Radiology No results found.  Procedures Procedures (including critical care time)  Medications Ordered in UC Medications - No data to display  Initial Impression / Assessment and Plan / UC Course  I have reviewed the triage vital signs and the nursing notes.  Pertinent labs & imaging results that were available during my care of the patient were reviewed by me and considered in my medical decision making (see chart for details).     Patient is well-appearing, afebrile, nontoxic.  She was initially tachycardic but this improved after sitting quietly for several minutes.  No indication for I&D as patient has active drainage from the lesion without area of fluctuance.  We initially discussed potentiality of going to the emergency room for imaging given severity of pain, however, patient declined to do so.  Will treat with cephalexin  and Bactrim  DS.  Notification for dose adjustment based  on metabolic panel from 12/01/2022 with creatinine of 1.07 and calculated creatinine clearance of 96 mL/min.  We discussed that if she develops any rash or lesions she should stop the medication to be seen immediately.  She is to use warm compresses to help manage the pain and encourage drainage.  She has oxycodone  prescribed and so we will use this to manage her pain.  CBC and CMP were obtained and we will contact her if these are abnormal and show abnormal kidney function or significant leukocytosis that would require emergency room evaluation.  We discussed that ideally she should follow-up with surgeon for further evaluation and management if this is not responding quickly to the antibiotics and was given the contact information for local provider with instruction to call to schedule an appointment.  We discussed that if anything worsens or changes she needs to be seen in the emergency room.  Strict return precautions given.  All questions answered to patient's satisfaction.  Final Clinical Impressions(s) / UC Diagnoses   Final diagnoses:  Axillary abscess     Discharge Instructions      We are treating you for an infection in your armpit.  Start cephalexin  4 times daily for 1 week.  Start Bactrim  DS twice daily for 1 week.  If you develop any rash or lesions stop the medication and be seen immediately.  I will contact you if your blood work is abnormal and changes our treatment plan.  I would like you to follow-up with surgeon as soon as possible.  Call them to schedule an appointment.  Use warm compresses on the area and continue with your  prescribed pain medication.  If you have any worsening symptoms including increasing pain, fever, nausea, vomiting, spread of redness, red streaks please go to the emergency room immediately.  I would like you to follow-up with someone within 1 to 2 days.  Ideally, I would like this to be the surgeon but if you cannot see them you can follow-up with your primary  care or our clinic.  You should have a low threshold for going to the emergency room if anything is worsening or symptoms are not improving.     ED Prescriptions     Medication Sig Dispense Auth. Provider   cephALEXin  (KEFLEX ) 500 MG capsule Take 1 capsule (500 mg total) by mouth 4 (four) times daily. 28 capsule Raylyn Speckman K, PA-C   sulfamethoxazole -trimethoprim  (BACTRIM  DS) 800-160 MG tablet Take 1 tablet by mouth 2 (two) times daily for 7 days. 14 tablet Memory Heinrichs K, PA-C      PDMP not reviewed this encounter.   Sherrell Rocky POUR, PA-C 08/23/24 1623    Tamma Brigandi K, PA-C 08/23/24 1624

## 2024-08-23 NOTE — Discharge Instructions (Addendum)
 We are treating you for an infection in your armpit.  Start cephalexin  4 times daily for 1 week.  Start Bactrim  DS twice daily for 1 week.  If you develop any rash or lesions stop the medication and be seen immediately.  I will contact you if your blood work is abnormal and changes our treatment plan.  I would like you to follow-up with surgeon as soon as possible.  Call them to schedule an appointment.  Use warm compresses on the area and continue with your prescribed pain medication.  If you have any worsening symptoms including increasing pain, fever, nausea, vomiting, spread of redness, red streaks please go to the emergency room immediately.  I would like you to follow-up with someone within 1 to 2 days.  Ideally, I would like this to be the surgeon but if you cannot see them you can follow-up with your primary care or our clinic.  You should have a low threshold for going to the emergency room if anything is worsening or symptoms are not improving.

## 2024-08-24 ENCOUNTER — Ambulatory Visit (HOSPITAL_COMMUNITY): Payer: Self-pay

## 2024-08-25 ENCOUNTER — Ambulatory Visit (HOSPITAL_COMMUNITY)
Admission: RE | Admit: 2024-08-25 | Discharge: 2024-08-25 | Disposition: A | Discharge status: Home / Self Care | Source: Ambulatory Visit | Attending: Internal Medicine | Admitting: Internal Medicine

## 2024-08-25 ENCOUNTER — Encounter (HOSPITAL_COMMUNITY): Payer: Self-pay

## 2024-08-25 VITALS — BP 129/78 | HR 78 | Temp 98.4°F | Resp 16

## 2024-08-25 DIAGNOSIS — Z5189 Encounter for other specified aftercare: Secondary | ICD-10-CM | POA: Diagnosis not present

## 2024-08-25 NOTE — ED Provider Notes (Signed)
 [facility]  Note:  This document was prepared using Dragon voice recognition software and may include unintentional dictation errors.  MRN: [id number] DOB: 09-03-1966  Subjective:   Audrey Guerra is a 59 y.o. female presenting for wound reevaluation of right axillary abscess which was originally evaluated here in urgent care 2 days ago.  Patient was placed on antibiotic therapy by PA in urgent care.  Patient reports she is taking antibiotic therapy and now the abscess has ruptured draining thick mucopurulent discharge from the right axilla.  Patient reports the pain is only 2/10 and much improved from previous.  Patient denies any fever or other secondary symptoms.  No current facility-administered medications for this encounter.  Current Outpatient Medications:    cephALEXin  (KEFLEX ) 500 MG capsule, Take 1 capsule (500 mg total) by mouth 4 (four) times daily., Disp: 28 capsule, Rfl: 0   furosemide  (LASIX ) 20 MG tablet, TAKE 1 TABLET(20 MG) BY MOUTH DAILY, Disp: 90 tablet, Rfl: 3   hydroxypropyl methylcellulose / hypromellose (ISOPTO TEARS / GONIOVISC) 2.5 % ophthalmic solution, Place 1 drop into both eyes daily as needed for dry eyes., Disp: , Rfl:    lidocaine  (XYLOCAINE ) 5 % ointment, Apply 1 Application topically as needed., Disp: 50 g, Rfl: 0   Menthol, Topical Analgesic, (ICY HOT) 5 % PTCH, Apply 1 patch topically daily as needed (Knee pain)., Disp: , Rfl:    metoprolol  tartrate (LOPRESSOR ) 50 MG tablet, Take 1 tablet (50 mg total) by mouth 2 (two) times daily., Disp: 60 tablet, Rfl: 3   oxyCODONE -acetaminophen  (PERCOCET/ROXICET) 5-325 MG tablet, Take 1-2 tablets by mouth every 6 (six) hours as needed for severe pain (pain score 7-10)., Disp: 10 tablet, Rfl: 0   pantoprazole  (PROTONIX ) 40 MG tablet, Take 1 tablet (40 mg total) by mouth daily., Disp: 90 tablet, Rfl: 0   rivaroxaban  (XARELTO ) 20 MG TABS tablet, Take 1 tablet (20 mg total) by mouth  daily., Disp: 90 tablet, Rfl: 3   rosuvastatin  (CRESTOR ) 5 MG tablet, Take 5 mg by mouth daily., Disp: , Rfl:    sulfamethoxazole -trimethoprim  (BACTRIM  DS) 800-160 MG tablet, Take 1 tablet by mouth 2 (two) times daily for 7 days., Disp: 14 tablet, Rfl: 0   No Known Allergies  Past Medical History:  Diagnosis Date   Atrial fibrillation (HCC)    BMI 40.0-44.9, adult (HCC)    Cardiomegaly    Hyperlipidemia    Hypertension    Morbid obesity (HCC)    Osteoarthritis    PTSD (post-traumatic stress disorder)      Past Surgical History:  Procedure Laterality Date   CARDIOVERSION N/A 12/01/2022   Procedure: CARDIOVERSION;  Surgeon: Lonni Slain, MD;  Location: [facility] ENDOSCOPY;  Service: Cardiovascular;  Laterality: N/A;   CESAREAN SECTION     2 previous c-sections   LAPAROSCOPIC GASTRIC SLEEVE RESECTION N/A 06/30/2022   Procedure: LAPAROSCOPIC SLEEVE GASTRECTOMY;  Surgeon: Stevie Herlene Righter, MD;  Location: [facility] ORS;  Service: General;  Laterality: N/A;   UPPER GI ENDOSCOPY N/A 06/30/2022   Procedure: UPPER GI ENDOSCOPY;  Surgeon: Stevie, Herlene Righter, MD;  Location: [facility] ORS;  Service: General;  Laterality: N/A;    Family History  Problem Relation Age of Onset   Diabetes Mother    Diabetes Father     Social History   Tobacco Use   Smoking status: Former   Smokeless tobacco: Never  Vaping Use   Vaping status: Never Used  Substance Use Topics   Alcohol  use: Not  Currently    Comment: social   Drug use: No    ROS Refer to HPI for ROS details.  Objective:    Vitals: BP 129/78 (BP Location: Right Arm)   Pulse 78   Temp 98.4 F (36.9 C) (Oral)   Resp 16   SpO2 96%   Physical Exam Vitals and nursing note reviewed.  Constitutional:      General: She is not in acute distress.    Appearance: Normal appearance. She is not ill-appearing.  HENT:     Head: Normocephalic.  Cardiovascular:     Rate and Rhythm: Normal rate.  Pulmonary:     Effort: Pulmonary effort  is normal. No respiratory distress.  Skin:    General: Skin is warm and dry.     Capillary Refill: Capillary refill takes less than 2 seconds.     Findings: Abscess, erythema and wound present.  Neurological:     General: No focal deficit present.     Mental Status: She is alert and oriented to person, place, and time.  Psychiatric:        Mood and Affect: Mood normal.        Behavior: Behavior normal.     Procedures  No results found for this or any previous visit (from the past 24 hours).  Assessment and Plan :     Discharge Instructions       1. Wound check, abscess (Primary) - Continue to allow wound to drain to release as much pus as possible. - Continue antibiotic therapy as directed to kill any remaining infection. - Continue to monitor area for any change in severity if there is any escalation of current symptoms or development of new symptoms such as increased redness, increased warmth, increased drainage, pain, fever follow-up for further evaluation and treatment.      Jennetta Flood B Charmian Forbis   Jahquan Klugh, [place], [place] 08/25/24 1807

## 2024-08-25 NOTE — Discharge Instructions (Signed)
  1. Wound check, abscess (Primary) - Continue to allow wound to drain to release as much pus as possible. - Continue antibiotic therapy as directed to kill any remaining infection. - Continue to monitor area for any change in severity if there is any escalation of current symptoms or development of new symptoms such as increased redness, increased warmth, increased drainage, pain, fever follow-up for further evaluation and treatment.

## 2024-08-25 NOTE — ED Triage Notes (Signed)
 Pt states she is here for a wound check to abscess in her right axilla.  States she has been taking her antibiotics as prescribed and that it is getting better.

## 2025-01-19 ENCOUNTER — Encounter (HOSPITAL_COMMUNITY): Payer: Self-pay | Admitting: *Deleted
